# Patient Record
Sex: Female | Born: 1985 | Race: Black or African American | Hispanic: No | Marital: Single | Smoking: Never smoker
Health system: Southern US, Community
[De-identification: ages and names within clinical notes are randomized; demographics above are authoritative.]

## PROBLEM LIST (undated history)

## (undated) DIAGNOSIS — N390 Urinary tract infection, site not specified: Secondary | ICD-10-CM

---

## 2008-09-24 ENCOUNTER — Emergency Department (HOSPITAL_COMMUNITY): Admission: EM | Admit: 2008-09-24 | Discharge: 2008-09-24 | Payer: Self-pay | Admitting: Family Medicine

## 2008-10-09 ENCOUNTER — Inpatient Hospital Stay (HOSPITAL_COMMUNITY): Admission: AD | Admit: 2008-10-09 | Discharge: 2008-10-09 | Payer: Self-pay | Admitting: Obstetrics and Gynecology

## 2008-10-09 ENCOUNTER — Inpatient Hospital Stay (HOSPITAL_COMMUNITY): Admission: AD | Admit: 2008-10-09 | Discharge: 2008-10-10 | Payer: Self-pay | Admitting: Obstetrics and Gynecology

## 2008-10-11 ENCOUNTER — Inpatient Hospital Stay (HOSPITAL_COMMUNITY): Admission: AD | Admit: 2008-10-11 | Discharge: 2008-10-11 | Payer: Self-pay | Admitting: Obstetrics and Gynecology

## 2008-10-18 ENCOUNTER — Inpatient Hospital Stay (HOSPITAL_COMMUNITY): Admission: AD | Admit: 2008-10-18 | Discharge: 2008-10-18 | Payer: Self-pay | Admitting: Obstetrics and Gynecology

## 2009-04-19 ENCOUNTER — Inpatient Hospital Stay (HOSPITAL_COMMUNITY): Admission: AD | Admit: 2009-04-19 | Discharge: 2009-04-19 | Payer: Self-pay | Admitting: Obstetrics and Gynecology

## 2009-10-11 ENCOUNTER — Inpatient Hospital Stay (HOSPITAL_COMMUNITY): Admission: AD | Admit: 2009-10-11 | Discharge: 2009-10-14 | Payer: Self-pay | Admitting: Obstetrics and Gynecology

## 2011-02-10 ENCOUNTER — Emergency Department (HOSPITAL_COMMUNITY)
Admission: EM | Admit: 2011-02-10 | Discharge: 2011-02-10 | Disposition: A | Discharge status: Home / Self Care | Payer: No Typology Code available for payment source | Attending: Emergency Medicine | Admitting: Emergency Medicine

## 2011-02-10 DIAGNOSIS — R51 Headache: Secondary | ICD-10-CM | POA: Insufficient documentation

## 2011-02-10 DIAGNOSIS — M549 Dorsalgia, unspecified: Secondary | ICD-10-CM | POA: Insufficient documentation

## 2011-02-10 DIAGNOSIS — S335XXA Sprain of ligaments of lumbar spine, initial encounter: Secondary | ICD-10-CM | POA: Insufficient documentation

## 2011-02-10 DIAGNOSIS — S139XXA Sprain of joints and ligaments of unspecified parts of neck, initial encounter: Secondary | ICD-10-CM | POA: Insufficient documentation

## 2011-02-10 DIAGNOSIS — M542 Cervicalgia: Secondary | ICD-10-CM | POA: Insufficient documentation

## 2011-02-21 LAB — CBC
HCT: 30.7 % — ABNORMAL LOW (ref 36.0–46.0)
Hemoglobin: 10.1 g/dL — ABNORMAL LOW (ref 12.0–15.0)
MCHC: 33 g/dL (ref 30.0–36.0)
MCV: 82.5 fL (ref 78.0–100.0)
Platelets: 256 10*3/uL (ref 150–400)
Platelets: 286 10*3/uL (ref 150–400)
RBC: 3.38 MIL/uL — ABNORMAL LOW (ref 3.87–5.11)
WBC: 5.9 10*3/uL (ref 4.0–10.5)

## 2011-02-21 LAB — RPR: RPR Ser Ql: NONREACTIVE

## 2011-06-10 ENCOUNTER — Emergency Department (HOSPITAL_COMMUNITY)
Admission: EM | Admit: 2011-06-10 | Discharge: 2011-06-10 | Disposition: A | Discharge status: Home / Self Care | Payer: Self-pay | Attending: Emergency Medicine | Admitting: Emergency Medicine

## 2011-06-10 DIAGNOSIS — B9689 Other specified bacterial agents as the cause of diseases classified elsewhere: Secondary | ICD-10-CM | POA: Insufficient documentation

## 2011-06-10 DIAGNOSIS — N949 Unspecified condition associated with female genital organs and menstrual cycle: Secondary | ICD-10-CM | POA: Insufficient documentation

## 2011-06-10 DIAGNOSIS — N760 Acute vaginitis: Secondary | ICD-10-CM | POA: Insufficient documentation

## 2011-06-10 DIAGNOSIS — A499 Bacterial infection, unspecified: Secondary | ICD-10-CM | POA: Insufficient documentation

## 2011-06-10 DIAGNOSIS — N390 Urinary tract infection, site not specified: Secondary | ICD-10-CM | POA: Insufficient documentation

## 2011-06-10 DIAGNOSIS — R109 Unspecified abdominal pain: Secondary | ICD-10-CM | POA: Insufficient documentation

## 2011-06-10 DIAGNOSIS — K6289 Other specified diseases of anus and rectum: Secondary | ICD-10-CM | POA: Insufficient documentation

## 2011-06-10 DIAGNOSIS — R350 Frequency of micturition: Secondary | ICD-10-CM | POA: Insufficient documentation

## 2011-06-10 LAB — URINE MICROSCOPIC-ADD ON

## 2011-06-10 LAB — POCT PREGNANCY, URINE: Preg Test, Ur: NEGATIVE

## 2011-06-10 LAB — WET PREP, GENITAL: Yeast Wet Prep HPF POC: NONE SEEN

## 2011-06-10 LAB — URINALYSIS, ROUTINE W REFLEX MICROSCOPIC
Bilirubin Urine: NEGATIVE
Ketones, ur: NEGATIVE mg/dL
Protein, ur: 100 mg/dL — AB
Specific Gravity, Urine: 1.016 (ref 1.005–1.030)

## 2011-06-12 LAB — URINE CULTURE

## 2011-08-21 LAB — POCT URINALYSIS DIP (DEVICE)
Bilirubin Urine: NEGATIVE
Ketones, ur: NEGATIVE mg/dL
Operator id: 247071
Protein, ur: 30 mg/dL — AB
Specific Gravity, Urine: 1.02 (ref 1.005–1.030)

## 2011-08-21 LAB — POCT PREGNANCY, URINE: Preg Test, Ur: POSITIVE

## 2011-08-22 LAB — CBC
HCT: 28.3 — ABNORMAL LOW
HCT: 28.6 — ABNORMAL LOW
Hemoglobin: 9.6 — ABNORMAL LOW
MCV: 76.1 — ABNORMAL LOW
MCV: 76.4 — ABNORMAL LOW
Platelets: 379
RBC: 3.7 — ABNORMAL LOW
RBC: 3.76 — ABNORMAL LOW
RDW: 16.5 — ABNORMAL HIGH
RDW: 16.9 — ABNORMAL HIGH
WBC: 4.4
WBC: 7

## 2011-08-22 LAB — HCG, QUANTITATIVE, PREGNANCY: hCG, Beta Chain, Quant, S: 939 — ABNORMAL HIGH

## 2011-08-22 LAB — WET PREP, GENITAL: Trich, Wet Prep: NONE SEEN

## 2014-04-14 ENCOUNTER — Encounter (HOSPITAL_COMMUNITY): Payer: Self-pay | Admitting: Emergency Medicine

## 2014-04-14 ENCOUNTER — Emergency Department (HOSPITAL_COMMUNITY)
Admission: EM | Admit: 2014-04-14 | Discharge: 2014-04-14 | Disposition: A | Discharge status: Home / Self Care | Payer: PRIVATE HEALTH INSURANCE | Source: Home / Self Care | Attending: Family Medicine | Admitting: Family Medicine

## 2014-04-14 DIAGNOSIS — N390 Urinary tract infection, site not specified: Secondary | ICD-10-CM

## 2014-04-14 HISTORY — DX: Urinary tract infection, site not specified: N39.0

## 2014-04-14 LAB — POCT URINALYSIS DIP (DEVICE)
BILIRUBIN URINE: NEGATIVE
Glucose, UA: NEGATIVE mg/dL
Hgb urine dipstick: NEGATIVE
Ketones, ur: NEGATIVE mg/dL
Nitrite: NEGATIVE
Protein, ur: 30 mg/dL — AB
SPECIFIC GRAVITY, URINE: 1.02 (ref 1.005–1.030)
UROBILINOGEN UA: 1 mg/dL (ref 0.0–1.0)
pH: 6 (ref 5.0–8.0)

## 2014-04-14 MED ORDER — CEPHALEXIN 500 MG PO CAPS: 500.0000 mg | ORAL_CAPSULE | Freq: Four times a day (QID) | ORAL | Status: DC

## 2014-04-14 NOTE — ED Provider Notes (Signed)
CSN: [id number]     Arrival date & time 04/14/14  1117 History   First MD Initiated Contact with Patient 04/14/14 [phone]     Chief Complaint  Patient presents with  . Sore Throat  . Urinary Tract Infection   (Consider location/radiation/quality/duration/timing/severity/associated sxs/prior Treatment) Patient is a 28 y.o. female presenting with dysuria. The history is provided by the patient. No language interpreter was used.  Dysuria Pain quality:  Aching and burning Pain severity:  Moderate Onset quality:  Gradual Timing:  Constant Progression:  Worsening Chronicity:  New Recent urinary tract infections: no   Relieved by:  Nothing Worsened by:  Nothing tried Ineffective treatments:  None tried Risk factors: pregnant now     Past Medical History  Diagnosis Date  . UTI (lower urinary tract infection)    Past Surgical History  Procedure Laterality Date  . Cesarean section     No family history on file. History  Substance Use Topics  . Smoking status: Never Smoker   . Smokeless tobacco: Not on file  . Alcohol Use: Yes   OB History   Grav Para Term Preterm Abortions TAB SAB Ect Mult Living                 Review of Systems  Genitourinary: Positive for dysuria.  All other systems reviewed and are negative.   Allergies  Review of patient's allergies indicates no known allergies.  Home Medications   Prior to Admission medications   Medication Sig Start Date End Date Taking? Authorizing Provider  phenazopyridine (AZO-TABS) 95 MG tablet Take 95 mg by mouth 3 (three) times daily as needed for pain.   Yes Historical Provider, MD   BP 126/89  Pulse 100  Temp(Src) 98.8 F (37.1 C) (Oral)  Resp 20  SpO2 99%  LMP 04/06/2014 Physical Exam  Nursing note and vitals reviewed. Constitutional: She is oriented to person, place, and time. She appears well-developed and well-nourished.  HENT:  Head: Normocephalic.  Eyes: Conjunctivae and EOM are normal. Pupils are equal,  round, and reactive to light.  Neck: Normal range of motion.  Cardiovascular: Normal rate and normal heart sounds.   Pulmonary/Chest: Effort normal.  Abdominal: Soft. She exhibits no distension.  Musculoskeletal: Normal range of motion.  Neurological: She is alert and oriented to person, place, and time.  Skin: Skin is warm.  Psychiatric: She has a normal mood and affect.    ED Course  Procedures (including critical care time) Labs Review Labs Reviewed  POCT URINALYSIS DIP (DEVICE) - Abnormal; Notable for the following:    Protein, ur 30 (*)    Leukocytes, UA SMALL (*)    All other components within normal limits    Imaging Review No results found.   MDM   1. UTI (lower urinary tract infection)    Keflex 500mg  qid x 10 days    Elson Areas, PA-C 04/14/14 1013

## 2014-04-14 NOTE — ED Notes (Signed)
Onset Friday (5/22) of symptoms:  Patient has abdominal pain, low back pain, and these areas are painful with getting up and sitting down.  Reports "weird" feeling with urination.

## 2014-04-14 NOTE — Discharge Instructions (Signed)
Urinary Tract Infection  Urinary tract infections (UTIs) can develop anywhere along your urinary tract. Your urinary tract is your body's drainage system for removing wastes and extra water. Your urinary tract includes two kidneys, two ureters, a bladder, and a urethra. Your kidneys are a pair of bean-shaped organs. Each kidney is about the size of your fist. They are located below your ribs, one on each side of your spine.  CAUSES  Infections are caused by microbes, which are microscopic organisms, including fungi, viruses, and bacteria. These organisms are so small that they can only be seen through a microscope. Bacteria are the microbes that most commonly cause UTIs.  SYMPTOMS   Symptoms of UTIs may vary by age and gender of the patient and by the location of the infection. Symptoms in young women typically include a frequent and intense urge to urinate and a painful, burning feeling in the bladder or urethra during urination. Older women and men are more likely to be tired, shaky, and weak and have muscle aches and abdominal pain. A fever may mean the infection is in your kidneys. Other symptoms of a kidney infection include pain in your back or sides below the ribs, nausea, and vomiting.  DIAGNOSIS  To diagnose a UTI, your caregiver will ask you about your symptoms. Your caregiver also will ask to provide a urine sample. The urine sample will be tested for bacteria and white blood cells. White blood cells are made by your body to help fight infection.  TREATMENT   Typically, UTIs can be treated with medication. Because most UTIs are caused by a bacterial infection, they usually can be treated with the use of antibiotics. The choice of antibiotic and length of treatment depend on your symptoms and the type of bacteria causing your infection.  HOME CARE INSTRUCTIONS   If you were prescribed antibiotics, take them exactly as your caregiver instructs you. Finish the medication even if you feel better after you  have only taken some of the medication.   Drink enough water and fluids to keep your urine clear or pale yellow.   Avoid caffeine, tea, and carbonated beverages. They tend to irritate your bladder.   Empty your bladder often. Avoid holding urine for long periods of time.   Empty your bladder before and after sexual intercourse.   After a bowel movement, women should cleanse from front to back. Use each tissue only once.  SEEK MEDICAL CARE IF:    You have back pain.   You develop a fever.   Your symptoms do not begin to resolve within 3 days.  SEEK IMMEDIATE MEDICAL CARE IF:    You have severe back pain or lower abdominal pain.   You develop chills.   You have nausea or vomiting.   You have continued burning or discomfort with urination.  MAKE SURE YOU:    Understand these instructions.   Will watch your condition.   Will get help right away if you are not doing well or get worse.  Document Released: 08/15/2005 Document Revised: 05/06/2012 Document Reviewed: 12/14/2011  [employer] Patient Information 2014 [employer]

## 2014-04-15 NOTE — ED Provider Notes (Signed)
Medical screening examination/treatment/procedure(s) were performed by a resident physician or non-physician practitioner and as the supervising physician I was immediately available for consultation/collaboration.  Clementeen Graham, MD    Rodolph Bong, MD 04/15/14 [phone]

## 2014-04-18 ENCOUNTER — Emergency Department (HOSPITAL_COMMUNITY): Payer: PRIVATE HEALTH INSURANCE

## 2014-04-18 ENCOUNTER — Encounter (HOSPITAL_COMMUNITY): Payer: Self-pay | Admitting: Emergency Medicine

## 2014-04-18 ENCOUNTER — Emergency Department (HOSPITAL_COMMUNITY)
Admission: EM | Admit: 2014-04-18 | Discharge: 2014-04-19 | Disposition: A | Discharge status: Home / Self Care | Payer: PRIVATE HEALTH INSURANCE | Attending: Emergency Medicine | Admitting: Emergency Medicine

## 2014-04-18 DIAGNOSIS — T8332XA Displacement of intrauterine contraceptive device, initial encounter: Secondary | ICD-10-CM

## 2014-04-18 DIAGNOSIS — N939 Abnormal uterine and vaginal bleeding, unspecified: Secondary | ICD-10-CM

## 2014-04-18 DIAGNOSIS — Y849 Medical procedure, unspecified as the cause of abnormal reaction of the patient, or of later complication, without mention of misadventure at the time of the procedure: Secondary | ICD-10-CM | POA: Insufficient documentation

## 2014-04-18 DIAGNOSIS — N898 Other specified noninflammatory disorders of vagina: Secondary | ICD-10-CM | POA: Insufficient documentation

## 2014-04-18 DIAGNOSIS — Z3202 Encounter for pregnancy test, result negative: Secondary | ICD-10-CM | POA: Insufficient documentation

## 2014-04-18 DIAGNOSIS — T8339XA Other mechanical complication of intrauterine contraceptive device, initial encounter: Secondary | ICD-10-CM | POA: Insufficient documentation

## 2014-04-18 DIAGNOSIS — R109 Unspecified abdominal pain: Secondary | ICD-10-CM

## 2014-04-18 DIAGNOSIS — Z87440 Personal history of urinary (tract) infections: Secondary | ICD-10-CM | POA: Insufficient documentation

## 2014-04-18 DIAGNOSIS — Z792 Long term (current) use of antibiotics: Secondary | ICD-10-CM | POA: Insufficient documentation

## 2014-04-18 DIAGNOSIS — Z9889 Other specified postprocedural states: Secondary | ICD-10-CM | POA: Insufficient documentation

## 2014-04-18 LAB — CBC WITH DIFFERENTIAL/PLATELET
BASOS ABS: 0 10*3/uL (ref 0.0–0.1)
BASOS PCT: 0 % (ref 0–1)
EOS ABS: 0.1 10*3/uL (ref 0.0–0.7)
Eosinophils Relative: 2 % (ref 0–5)
HCT: 33.8 % — ABNORMAL LOW (ref 36.0–46.0)
HEMOGLOBIN: 11.1 g/dL — AB (ref 12.0–15.0)
Lymphocytes Relative: 40 % (ref 12–46)
Lymphs Abs: 2 10*3/uL (ref 0.7–4.0)
MCH: 25.7 pg — AB (ref 26.0–34.0)
MCHC: 32.8 g/dL (ref 30.0–36.0)
MCV: 78.2 fL (ref 78.0–100.0)
MONO ABS: 0.4 10*3/uL (ref 0.1–1.0)
Monocytes Relative: 8 % (ref 3–12)
Neutro Abs: 2.6 10*3/uL (ref 1.7–7.7)
Neutrophils Relative %: 50 % (ref 43–77)
Platelets: 397 10*3/uL (ref 150–400)
RBC: 4.32 MIL/uL (ref 3.87–5.11)
RDW: 14.8 % (ref 11.5–15.5)
WBC: 5.1 10*3/uL (ref 4.0–10.5)

## 2014-04-18 LAB — COMPREHENSIVE METABOLIC PANEL
ALBUMIN: 3.3 g/dL — AB (ref 3.5–5.2)
ALT: 16 U/L (ref 0–35)
AST: 13 U/L (ref 0–37)
Alkaline Phosphatase: 60 U/L (ref 39–117)
BUN: 9 mg/dL (ref 6–23)
CALCIUM: 9.7 mg/dL (ref 8.4–10.5)
CO2: 26 mEq/L (ref 19–32)
CREATININE: 0.69 mg/dL (ref 0.50–1.10)
Chloride: 104 mEq/L (ref 96–112)
GFR calc Af Amer: >90 mL/min (ref >90)
GFR calc non Af Amer: >90 mL/min (ref >90)
Glucose, Bld: 93 mg/dL (ref 70–99)
Potassium: 4.2 mEq/L (ref 3.7–5.3)
Sodium: 141 mEq/L (ref 137–147)
TOTAL PROTEIN: 8 g/dL (ref 6.0–8.3)
Total Bilirubin: 0.2 mg/dL — ABNORMAL LOW (ref 0.3–1.2)

## 2014-04-18 LAB — URINALYSIS, ROUTINE W REFLEX MICROSCOPIC
BILIRUBIN URINE: NEGATIVE
Glucose, UA: NEGATIVE mg/dL
Ketones, ur: NEGATIVE mg/dL
Nitrite: NEGATIVE
Protein, ur: 30 mg/dL — AB
Specific Gravity, Urine: 1.02 (ref 1.005–1.030)
UROBILINOGEN UA: 0.2 mg/dL (ref 0.0–1.0)
pH: 5.5 (ref 5.0–8.0)

## 2014-04-18 LAB — URINE MICROSCOPIC-ADD ON

## 2014-04-18 LAB — LIPASE, BLOOD: LIPASE: 31 U/L (ref 11–59)

## 2014-04-18 LAB — POC URINE PREG, ED: Preg Test, Ur: NEGATIVE

## 2014-04-18 MED ORDER — ONDANSETRON HCL 4 MG/2ML IJ SOLN
4.0000 mg | Freq: Once | INTRAMUSCULAR | Status: AC
  Administered 2014-04-18: 4 mg via INTRAVENOUS
  Filled 2014-04-18: qty 2

## 2014-04-18 MED ORDER — MORPHINE SULFATE 4 MG/ML IJ SOLN
4.0000 mg | Freq: Once | INTRAMUSCULAR | Status: AC
  Administered 2014-04-18: 4 mg via INTRAVENOUS
  Filled 2014-04-18: qty 1

## 2014-04-18 NOTE — ED Notes (Signed)
MD at bedside. 

## 2014-04-18 NOTE — ED Notes (Addendum)
Pt returned from CT °

## 2014-04-18 NOTE — ED Provider Notes (Signed)
CSN: [id number]     Arrival date & time 04/18/14  1938 History   First MD Initiated Contact with Patient 04/18/14 2159     Chief Complaint  Patient presents with  . poss uti       Patient is a 28 y.o. female presenting with flank pain. The history is provided by the patient.  Flank Pain This is a new problem. The current episode started 2 days ago. The problem occurs constantly. The problem has been gradually worsening. Pertinent negatives include no chest pain. Nothing aggravates the symptoms. Nothing relieves the symptoms.  pt reports diagnosed with UTI last week She has been taking keflex Starting in the past 48 hours she has noted left flank pain that radiates into left lower abdomen No fever No vomiting She also reports heavy vaginal bleeding but reports she has IUD in palce  Past Medical History  Diagnosis Date  . UTI (lower urinary tract infection)    Past Surgical History  Procedure Laterality Date  . Cesarean section     No family history on file. History  Substance Use Topics  . Smoking status: Never Smoker   . Smokeless tobacco: Not on file  . Alcohol Use: Yes   OB History   Grav Para Term Preterm Abortions TAB SAB Ect Mult Living                 Review of Systems  Cardiovascular: Negative for chest pain.  Gastrointestinal: Negative for vomiting.  Genitourinary: Positive for flank pain and vaginal bleeding.  Neurological: Negative for weakness.  All other systems reviewed and are negative.     Allergies  Review of patient's allergies indicates no known allergies.  Home Medications   Prior to Admission medications   Medication Sig Start Date End Date Taking? Authorizing Provider  acetaminophen (TYLENOL) 500 MG tablet Take 500 mg by mouth every 6 (six) hours as needed for mild pain.   Yes Historical Provider, MD  cephALEXin (KEFLEX) 500 MG capsule Take 1 capsule (500 mg total) by mouth 4 (four) times daily. 04/14/14  Yes Elson AreasLeslie K Sofia, PA-C   levonorgestrel (MIRENA) 20 MCG/24HR IUD 1 each by Intrauterine route once.   Yes Historical Provider, MD  phenazopyridine (AZO-TABS) 95 MG tablet Take 95 mg by mouth 3 (three) times daily as needed for pain.   Yes Historical Provider, MD   BP 125/92  Pulse 96  Temp(Src) 98.2 F (36.8 C) (Oral)  Resp 18  SpO2 100%  LMP 04/06/2014 Physical Exam CONSTITUTIONAL: Well developed/well nourished HEAD: Normocephalic/atraumatic EYES: EOMI/PERRL ENMT: Mucous membranes moist NECK: supple no meningeal signs SPINE:entire spine nontender CV: S1/S2 noted, no murmurs/rubs/gallops noted LUNGS: Lungs are clear to auscultation bilaterally, no apparent distress ABDOMEN: soft, nontender, no rebound or guarding. She is obese WU:JWJXGU:left cva tenderness Vaginal bleeding noted on pelvic.  No cmt.  No adnexal mass/tenderness.  Female chaperone present NEURO: Pt is awake/alert, moves all extremitiesx4 EXTREMITIES: pulses normal, full ROM SKIN: warm, color normal PSYCH: no abnormalities of mood noted  ED Course  Procedures  10:55 PM Pt with recent diagnosis of uti, now with flank pain Will obtain CT imaging to evaluate for any ureteral stone 12:27 AM Pt improved CT imaging reveals malpositioned IUD but no other complicating features or perforation Abdomen soft on recheck She was advised to avoid sexual activity and call her OBGYN She is to continue her home antibiotics We discussed strict return precautions Labs Review Labs Reviewed  CBC WITH DIFFERENTIAL - Abnormal; Notable  for the following:    Hemoglobin 11.1 (*)    HCT 33.8 (*)    MCH 25.7 (*)    All other components within normal limits  COMPREHENSIVE METABOLIC PANEL - Abnormal; Notable for the following:    Albumin 3.3 (*)    Total Bilirubin 0.2 (*)    All other components within normal limits  URINALYSIS, ROUTINE W REFLEX MICROSCOPIC - Abnormal; Notable for the following:    Color, Urine RED (*)    APPearance CLOUDY (*)    Hgb urine  dipstick LARGE (*)    Protein, ur 30 (*)    Leukocytes, UA MODERATE (*)    All other components within normal limits  URINE MICROSCOPIC-ADD ON - Abnormal; Notable for the following:    Squamous Epithelial / LPF FEW (*)    Bacteria, UA FEW (*)    All other components within normal limits  LIPASE, BLOOD  POC URINE PREG, ED    Imaging Review Ct Abdomen Pelvis Wo Contrast  04/18/2014   CLINICAL DATA:  Flank pain. Currently treated for urinary tract infection.  EXAM: CT ABDOMEN AND PELVIS WITHOUT CONTRAST  TECHNIQUE: Multidetector CT imaging of the abdomen and pelvis was performed following the standard protocol without IV contrast.  COMPARISON:  None.  FINDINGS: BODY WALL: Umbilical fat herniation.  LOWER CHEST: Unremarkable.  ABDOMEN/PELVIS:  Liver: No focal abnormality.  Biliary: No evidence of biliary obstruction or stone.  Pancreas: Unremarkable.  Spleen: Unremarkable.  Adrenals: Unremarkable.  Kidneys and ureters: No hydronephrosis or stone. No asymmetric enlargement.  Bladder: Unremarkable.  Reproductive: IUD which is rotated relative to the long axis of the uterus. Additionally, the crossbar appears short of the expected location of the fundic endometrial margin by approximately 2 cm. The stem extends into the posterior fornix or posterior cervix. No serosal perforation.  Bowel: No obstruction. Negative appendix.  Retroperitoneum: No mass or adenopathy.  Peritoneum: No free fluid or gas.  Vascular: No acute abnormality.  OSSEOUS: No acute abnormalities.  IMPRESSION: 1. No hydronephrosis. 2. Malpositioned IUD without serosal perforation.   Electronically Signed   By: Tiburcio Pea M.D.   On: 04/18/2014 23:55      MDM   Final diagnoses:  Malpositioned IUD  Vaginal bleeding  Flank pain    Nursing notes including past medical history and social history reviewed and considered in documentation Labs/vital reviewed and considered     Joya Gaskins, MD 04/19/14 [phone]

## 2014-04-18 NOTE — ED Notes (Signed)
A urine cuilture is pending from [facility]

## 2014-04-18 NOTE — ED Notes (Signed)
EDP and RN at bedside to perform pelvic exam. Pt has too much vaginal bleeding for swabs. EDP perform manual exam and speculum exam.

## 2014-04-18 NOTE — ED Notes (Signed)
Taking keflex for UTI, prescribed at [facility]. Culture results to be ready on Monday.  C/o pain and GU bleeding onset Friday. "not sure if it was my period or UTI related". Reports irregular periods d/t IUD. Describes pain as cramping that began in back and wraps around to abd (denies: nvd fever, vaginal sx, urgency, frequency, retention, numbness tingling radiation down legs or other sx).

## 2014-04-18 NOTE — ED Notes (Signed)
The pt was seen at [facility] on Wednesday and treated for a uti.  She is still taking the meds but since Friday the pain has increased in her lower back and her lower abd.  lmp now

## 2014-04-19 MED ORDER — OXYCODONE-ACETAMINOPHEN 5-325 MG PO TABS: 1.0000 | ORAL_TABLET | ORAL | Status: DC | PRN

## 2014-04-19 NOTE — Discharge Instructions (Signed)
PLEASE AVOID SEXUAL ACTIVITY UNTIL YOU ARE EVALUATED BY YOUR OBGYN   SEEK IMMEDIATE MEDICAL ATTENTION IF: The pain does not go away or becomes severe, particularly over the next 8-12 hours.  A temperature above 100.70F develops.  Repeated vomiting occurs (multiple episodes).   Blood is being passed in stools or vomit (bright red or black tarry stools).  Return also if you develop chest pain, difficulty breathing, dizziness or fainting, or become confused, poorly responsive, or inconsolable.

## 2014-04-19 NOTE — ED Notes (Signed)
Dr. Bebe Shaggy at [facility] speaking with pt & family about d/c plan and results. Pt lying down resting, NAD, calm, interactive.

## 2015-12-14 ENCOUNTER — Encounter (HOSPITAL_COMMUNITY): Payer: Self-pay | Admitting: Emergency Medicine

## 2015-12-14 ENCOUNTER — Emergency Department (HOSPITAL_COMMUNITY): Payer: PRIVATE HEALTH INSURANCE

## 2015-12-14 ENCOUNTER — Emergency Department (HOSPITAL_COMMUNITY)
Admission: EM | Admit: 2015-12-14 | Discharge: 2015-12-14 | Disposition: A | Discharge status: Home / Self Care | Payer: PRIVATE HEALTH INSURANCE | Attending: Emergency Medicine | Admitting: Emergency Medicine

## 2015-12-14 DIAGNOSIS — R51 Headache: Secondary | ICD-10-CM | POA: Insufficient documentation

## 2015-12-14 DIAGNOSIS — M25512 Pain in left shoulder: Secondary | ICD-10-CM | POA: Diagnosis not present

## 2015-12-14 DIAGNOSIS — H53149 Visual discomfort, unspecified: Secondary | ICD-10-CM | POA: Insufficient documentation

## 2015-12-14 DIAGNOSIS — E669 Obesity, unspecified: Secondary | ICD-10-CM | POA: Insufficient documentation

## 2015-12-14 DIAGNOSIS — R519 Headache, unspecified: Secondary | ICD-10-CM

## 2015-12-14 DIAGNOSIS — Z87440 Personal history of urinary (tract) infections: Secondary | ICD-10-CM | POA: Diagnosis not present

## 2015-12-14 DIAGNOSIS — R079 Chest pain, unspecified: Secondary | ICD-10-CM | POA: Insufficient documentation

## 2015-12-14 LAB — I-STAT TROPONIN, ED: Troponin i, poc: 0 ng/mL (ref 0.00–0.08)

## 2015-12-14 LAB — BASIC METABOLIC PANEL
ANION GAP: 9 (ref 5–15)
BUN: 5 mg/dL — AB (ref 6–20)
CO2: 27 mmol/L (ref 22–32)
Calcium: 9 mg/dL (ref 8.9–10.3)
Chloride: 106 mmol/L (ref 101–111)
Creatinine, Ser: 0.68 mg/dL (ref 0.44–1.00)
GFR calc Af Amer: >60 mL/min (ref >60)
Glucose, Bld: 110 mg/dL — ABNORMAL HIGH (ref 65–99)
Potassium: 3.6 mmol/L (ref 3.5–5.1)
Sodium: 142 mmol/L (ref 135–145)

## 2015-12-14 LAB — CBC
HCT: 31.4 % — ABNORMAL LOW (ref 36.0–46.0)
Hemoglobin: 9.9 g/dL — ABNORMAL LOW (ref 12.0–15.0)
MCH: 24.7 pg — AB (ref 26.0–34.0)
MCHC: 31.5 g/dL (ref 30.0–36.0)
MCV: 78.3 fL (ref 78.0–100.0)
PLATELETS: 354 10*3/uL (ref 150–400)
RBC: 4.01 MIL/uL (ref 3.87–5.11)
RDW: 15.7 % — ABNORMAL HIGH (ref 11.5–15.5)
WBC: 6.1 10*3/uL (ref 4.0–10.5)

## 2015-12-14 MED ORDER — ONDANSETRON HCL 4 MG/2ML IJ SOLN
4.0000 mg | Freq: Once | INTRAMUSCULAR | Status: AC
  Administered 2015-12-14: 4 mg via INTRAVENOUS
  Filled 2015-12-14: qty 2

## 2015-12-14 MED ORDER — SODIUM CHLORIDE 0.9 % IV BOLUS (SEPSIS)
1000.0000 mL | Freq: Once | INTRAVENOUS | Status: AC
  Administered 2015-12-14: 1000 mL via INTRAVENOUS

## 2015-12-14 MED ORDER — KETOROLAC TROMETHAMINE 30 MG/ML IJ SOLN
30.0000 mg | Freq: Once | INTRAMUSCULAR | Status: AC
  Administered 2015-12-14: 30 mg via INTRAVENOUS
  Filled 2015-12-14: qty 1

## 2015-12-14 MED ORDER — DIPHENHYDRAMINE HCL 50 MG/ML IJ SOLN
25.0000 mg | Freq: Once | INTRAMUSCULAR | Status: AC
  Administered 2015-12-14: 25 mg via INTRAVENOUS
  Filled 2015-12-14: qty 1

## 2015-12-14 NOTE — ED Notes (Signed)
PA at bedside.

## 2015-12-14 NOTE — Discharge Instructions (Signed)
General Headache Without Cause °A headache is pain or discomfort felt around the head or neck area. The specific cause of a headache may not be found. There are many causes and types of headaches. A few common ones are: °· Tension headaches. °· Migraine headaches. °· Cluster headaches. °· Chronic daily headaches. °HOME CARE INSTRUCTIONS  °Watch your condition for any changes. Take these steps to help with your condition: °Managing Pain °· Take over-the-counter and prescription medicines only as told by your health care provider. °· Lie down in a dark, quiet room when you have a headache. °· If directed, apply ice to the head and neck area: °· Put ice in a plastic bag. °· Place a towel between your skin and the bag. °· Leave the ice on for 20 minutes, 2-3 times per day. °· Use a heating pad or hot shower to apply heat to the head and neck area as told by your health care provider. °· Keep lights dim if bright lights bother you or make your headaches worse. °Eating and Drinking °· Eat meals on a regular schedule. °· Limit alcohol use. °· Decrease the amount of caffeine you drink, or stop drinking caffeine. °General Instructions °· Keep all follow-up visits as told by your health care provider. This is important. °· Keep a headache journal to help find out what may trigger your headaches. For example, write down: °· What you eat and drink. °· How much sleep you get. °· Any change to your diet or medicines. °· Try massage or other relaxation techniques. °· Limit stress. °· Sit up straight, and do not tense your muscles. °· Do not use tobacco products, including cigarettes, chewing tobacco, or e-cigarettes. If you need help quitting, ask your health care provider. °· Exercise regularly as told by your health care provider. °· Sleep on a regular schedule. Get 7-9 hours of sleep, or the amount recommended by your health care provider. °SEEK MEDICAL CARE IF:  °· Your symptoms are not helped by medicine. °· You have a  headache that is different from the usual headache. °· You have nausea or you vomit. °· You have a fever. °SEEK IMMEDIATE MEDICAL CARE IF:  °· Your headache becomes severe. °· You have repeated vomiting. °· You have a stiff neck. °· You have a loss of vision. °· You have problems with speech. °· You have pain in the eye or ear. °· You have muscular weakness or loss of muscle control. °· You lose your balance or have trouble walking. °· You feel faint or pass out. °· You have confusion. °  °This information is not intended to replace advice given to you by your health care provider. Make sure you discuss any questions you have with your health care provider. °  °Document Released: 11/05/2005 Document Revised: 07/27/2015 Document Reviewed: 02/28/2015 °Elsevier Interactive Patient Education ©2016 [employer] ° °Nonspecific Chest Pain  °Chest pain can be caused by many different conditions. There is always a chance that your pain could be related to something serious, such as a heart attack or a blood clot in your lungs. Chest pain can also be caused by conditions that are not life-threatening. If you have chest pain, it is very important to follow up with your health care provider. °CAUSES  °Chest pain can be caused by: °· Heartburn. °· Pneumonia or bronchitis. °· Anxiety or stress. °· Inflammation around your heart (pericarditis) or lung (pleuritis or pleurisy). °· A blood clot in your lung. °· A collapsed   lung (pneumothorax). It can develop suddenly on its own (spontaneous pneumothorax) or from trauma to the chest. °· Shingles infection (varicella-zoster virus). °· Heart attack. °· Damage to the bones, muscles, and cartilage that make up your chest wall. This can include: °¨ Bruised bones due to injury. °¨ Strained muscles or cartilage due to frequent or repeated coughing or overwork. °¨ Fracture to one or more ribs. °¨ Sore cartilage due to inflammation (costochondritis). °RISK FACTORS  °Risk factors for chest  pain may include: °· Activities that increase your risk for trauma or injury to your chest. °· Respiratory infections or conditions that cause frequent coughing. °· Medical conditions or overeating that can cause heartburn. °· Heart disease or family history of heart disease. °· Conditions or health behaviors that increase your risk of developing a blood clot. °· Having had chicken pox (varicella zoster). °SIGNS AND SYMPTOMS °Chest pain can feel like: °· Burning or tingling on the surface of your chest or deep in your chest. °· Crushing, pressure, aching, or squeezing pain. °· Dull or sharp pain that is worse when you move, cough, or take a deep breath. °· Pain that is also felt in your back, neck, shoulder, or arm, or pain that spreads to any of these areas. °Your chest pain may come and go, or it may stay constant. °DIAGNOSIS °Lab tests or other studies may be needed to find the cause of your pain. Your health care provider may have you take a test called an ambulatory ECG (electrocardiogram). An ECG records your heartbeat patterns at the time the test is performed. You may also have other tests, such as: °· Transthoracic echocardiogram (TTE). During echocardiography, sound waves are used to create a picture of all of the heart structures and to look at how blood flows through your heart. °· Transesophageal echocardiogram (TEE). This is a more advanced imaging test that obtains images from inside your body. It allows your health care provider to see your heart in finer detail. °· Cardiac monitoring. This allows your health care provider to monitor your heart rate and rhythm in real time. °· Holter monitor. This is a portable device that records your heartbeat and can help to diagnose abnormal heartbeats. It allows your health care provider to track your heart activity for several days, if needed. °· Stress tests. These can be done through exercise or by taking medicine that makes your heart beat more  quickly. °· Blood tests. °· Imaging tests. °TREATMENT  °Your treatment depends on what is causing your chest pain. Treatment may include: °· Medicines. These may include: °¨ Acid blockers for heartburn. °¨ Anti-inflammatory medicine. °¨ Pain medicine for inflammatory conditions. °¨ Antibiotic medicine, if an infection is present. °¨ Medicines to dissolve blood clots. °¨ Medicines to treat coronary artery disease. °· Supportive care for conditions that do not require medicines. This may include: °¨ Resting. °¨ Applying heat or cold packs to injured areas. °¨ Limiting activities until pain decreases. °HOME CARE INSTRUCTIONS °· If you were prescribed an antibiotic medicine, finish it all even if you start to feel better. °· Avoid any activities that bring on chest pain. °· Do not use any tobacco products, including cigarettes, chewing tobacco, or electronic cigarettes. If you need help quitting, ask your health care provider. °· Do not drink alcohol. °· Take medicines only as directed by your health care provider. °· Keep all follow-up visits as directed by your health care provider. This is important. This includes any further testing if your chest pain   does not go away. °· If heartburn is the cause for your chest pain, you may be told to keep your head raised (elevated) while sleeping. This reduces the chance that acid will go from your stomach into your esophagus. °· Make lifestyle changes as directed by your health care provider. These may include: °¨ Getting regular exercise. Ask your health care provider to suggest some activities that are safe for you. °¨ Eating a heart-healthy diet. A registered dietitian can help you to learn healthy eating options. °¨ Maintaining a healthy weight. °¨ Managing diabetes, if necessary. °¨ Reducing stress. °SEEK MEDICAL CARE IF: °· Your chest pain does not go away after treatment. °· You have a rash with blisters on your chest. °· You have a fever. °SEEK IMMEDIATE MEDICAL CARE  IF:  °· Your chest pain is worse. °· You have an increasing cough, or you cough up blood. °· You have severe abdominal pain. °· You have severe weakness. °· You faint. °· You have chills. °· You have sudden, unexplained chest discomfort. °· You have sudden, unexplained discomfort in your arms, back, neck, or jaw. °· You have shortness of breath at any time. °· You suddenly start to sweat, or your skin gets clammy. °· You feel nauseous or you vomit. °· You suddenly feel light-headed or dizzy. °· Your heart begins to beat quickly, or it feels like it is skipping beats. °These symptoms may represent a serious problem that is an emergency. Do not wait to see if the symptoms will go away. Get medical help right away. Call your local emergency services (911 in the [place] Do not drive yourself to the hospital. °  °This information is not intended to replace advice given to you by your health care provider. Make sure you discuss any questions you have with your health care provider. °  °Document Released: 08/15/2005 Document Revised: 11/26/2014 Document Reviewed: 06/11/2014 °Elsevier Interactive Patient Education ©2016 [employer] ° °

## 2015-12-14 NOTE — ED Provider Notes (Signed)
CSN: [id number]     Arrival date & time 12/14/15  1739 History   First MD Initiated Contact with Patient 12/14/15 2034     Chief Complaint  Patient presents with  . Chest Pain  . Headache     (Consider location/radiation/quality/duration/timing/severity/associated sxs/prior Treatment) HPI   Patient with a PMH of obesity, c-section presents to the ER with headache and CP for the past 4 days.   1. Headache  headache started slowly 4 days ago and gradually worsened. He intermittently goes away but then will return sometimes. She describes it as across the front of her for head and is a throbbing pain, she states it hard to hold her eyes open associated photophobia. She is not had any fevers, nausea, vomiting, diarrhea. Her headache is currently a 10 out of 10 pain. She states this is the main reason why she came to the ER although she is having some chest pain  2. Chest pain She started having chest pain the same time she is having headache she states it has been constant and at no time in the past 4 days hasn't improved. He describes it as a sharp pain at the base of her clavicle on the left. She says it does not get worse with lying flat, it does not get worse with exertion, it does not get worse with coughing or with movement. It does wax and wane mildly with headache. He denies having had chest pains in the past. She says that the pain resolved upon arrival to the emergency department.  She denies orthopnea, nausea, vomiting, diarrhea, weakness, fever, neck pain, rash, sore throat, ear pain, abdominal pain, shortness of breath, coughing, change in vision  Past Medical History  Diagnosis Date  . UTI (lower urinary tract infection)    Past Surgical History  Procedure Laterality Date  . Cesarean section     No family history on file. Social History  Substance Use Topics  . Smoking status: Never Smoker   . Smokeless tobacco: None  . Alcohol Use: Yes   OB History    No data  available     Review of Systems  Review of Systems All other systems negative except as documented in the HPI. All pertinent positives and negatives as reviewed in the HPI.   Allergies  Review of patient's allergies indicates no known allergies.  Home Medications   Prior to Admission medications   Medication Sig Start Date End Date Taking? Authorizing Provider  ibuprofen (ADVIL,MOTRIN) 200 MG tablet Take 200 mg by mouth every 6 (six) hours as needed for moderate pain.   Yes Historical Provider, MD  cephALEXin (KEFLEX) 500 MG capsule Take 1 capsule (500 mg total) by mouth 4 (four) times daily. Patient not taking: Reported on 12/14/2015 04/14/14   Elson Areas, PA-C  oxyCODONE-acetaminophen (PERCOCET/ROXICET) 5-325 MG per tablet Take 1 tablet by mouth every 4 (four) hours as needed for severe pain. Patient not taking: Reported on 12/14/2015 04/19/14   Zadie Rhine, MD   BP 124/82 mmHg  Pulse 95  Temp(Src) 98.8 F (37.1 C) (Oral)  Resp 20  Ht  (1.626 m)  Wt 140.615 kg  BMI 53.19 kg/m2  SpO2 96%  LMP 12/14/2015 Physical Exam  Constitutional: She appears well-developed and well-nourished. No distress.  HENT:  Head: Normocephalic and atraumatic.  Right Ear: Tympanic membrane and ear canal normal.  Left Ear: Tympanic membrane and ear canal normal.  Nose: Nose normal.  Mouth/Throat: Uvula is midline, oropharynx is  clear and moist and mucous membranes are normal.  Eyes: Pupils are equal, round, and reactive to light.  Neck: Normal range of motion. Neck supple.  Cardiovascular: Normal rate and regular rhythm.   Pulmonary/Chest: Effort normal and breath sounds normal. She has no decreased breath sounds. She has no wheezes. She exhibits no tenderness.  Abdominal: Soft.  No signs of abdominal distention  Musculoskeletal:  No LE swelling  Neurological: She is alert.  Acting at baseline Cranial nerves grossly intact on exam. Pt alert and oriented x 3 Upper and lower extremity  strength is symmetrical and physiologic Normal muscular tone No facial droop Coordination intact, no limb ataxia,No pronator drift  Skin: Skin is warm and dry. No rash noted.  Nursing note and vitals reviewed.   ED Course  Procedures (including critical care time) Labs Review Labs Reviewed  BASIC METABOLIC PANEL - Abnormal; Notable for the following:    Glucose, Bld 110 (*)    BUN 5 (*)    All other components within normal limits  CBC - Abnormal; Notable for the following:    Hemoglobin 9.9 (*)    HCT 31.4 (*)    MCH 24.7 (*)    RDW 15.7 (*)    All other components within normal limits  I-STAT TROPOININ, ED    Imaging Review Dg Chest 2 View  12/14/2015  CLINICAL DATA:  Chest pain and headache for 4 days EXAM: CHEST  2 VIEW COMPARISON:  None. FINDINGS: Lungs are clear. Heart size and pulmonary vascularity are normal. No adenopathy. No pneumothorax. No bone lesions. There is upper thoracic dextroscoliosis. IMPRESSION: No edema or consolidation. Electronically Signed   By: Bretta Bang III M.D.   On: 12/14/2015 18:13   I have personally reviewed and evaluated these images and lab results as part of my medical decision-making.   EKG Interpretation None      MDM   Final diagnoses:  Nonintractable headache, unspecified chronicity pattern, unspecified headache type    The patient is PERC negative with less than 2% chance of PE.  Patient is to be discharged with recommendation to follow up with PCP in regards to today's hospital visit. Chest pain is not likely of cardiac or pulmonary etiology d/t presentation, perc negative, VSS, no tracheal deviation, no JVD or new murmur, RRR, breath sounds equal bilaterally, EKG without acute abnormalities, negative troponin, and negative CXR. Pt has been advised to return to the ED is CP becomes exertional, associated with diaphoresis or nausea, radiates to left jaw/arm, worsens or becomes concerning in any way. Pt appears reliable for  follow up and is agreeable to discharge.   Presentation is non concerning for [facility], ICH, Meningitis, or temporal arteritis. Pt is afebrile with no focal neuro deficits, nuchal rigidity, or change in vision.  Patients symptoms resolved with fluids and pain medication in the ER, she feels safe her DC. Referral to Neuro and PCP.  Marlon Pel, PA-C 12/14/15 2300  Linwood Dibbles, MD 12/14/15 [phone]

## 2015-12-14 NOTE — ED Notes (Signed)
PT is asleep at this time. PT's visitor reports she has been asleep since she received the medication. PT's visitor has no requests at this time

## 2015-12-14 NOTE — ED Notes (Signed)
PT reports headache pain 10/10

## 2015-12-14 NOTE — ED Notes (Signed)
Pt to ED via [facility]>  C/o pain to L upper chest with headache x 4 days.  Denies sob, nausea, and vomiting.  Pain worse with movement.

## 2015-12-14 NOTE — ED Notes (Signed)
Pt ambulated to nursing station with no difficulty, steady gait.

## 2015-12-30 ENCOUNTER — Emergency Department (INDEPENDENT_AMBULATORY_CARE_PROVIDER_SITE_OTHER): Payer: PRIVATE HEALTH INSURANCE

## 2015-12-30 ENCOUNTER — Encounter (HOSPITAL_COMMUNITY): Payer: Self-pay | Admitting: *Deleted

## 2015-12-30 ENCOUNTER — Emergency Department (INDEPENDENT_AMBULATORY_CARE_PROVIDER_SITE_OTHER)
Admission: EM | Admit: 2015-12-30 | Discharge: 2015-12-30 | Disposition: A | Discharge status: Home / Self Care | Payer: PRIVATE HEALTH INSURANCE | Source: Home / Self Care | Attending: Internal Medicine | Admitting: Internal Medicine

## 2015-12-30 DIAGNOSIS — J111 Influenza due to unidentified influenza virus with other respiratory manifestations: Secondary | ICD-10-CM

## 2015-12-30 DIAGNOSIS — R69 Illness, unspecified: Principal | ICD-10-CM

## 2015-12-30 MED ORDER — KETOROLAC TROMETHAMINE 60 MG/2ML IM SOLN
60.0000 mg | Freq: Once | INTRAMUSCULAR | Status: AC
  Administered 2015-12-30: 60 mg via INTRAMUSCULAR

## 2015-12-30 MED ORDER — IPRATROPIUM BROMIDE 0.02 % IN SOLN
RESPIRATORY_TRACT | Status: AC
  Filled 2015-12-30: qty 2.5

## 2015-12-30 MED ORDER — ALBUTEROL SULFATE (2.5 MG/3ML) 0.083% IN NEBU
5.0000 mg | INHALATION_SOLUTION | Freq: Once | RESPIRATORY_TRACT | Status: AC
  Administered 2015-12-30: 5 mg via RESPIRATORY_TRACT

## 2015-12-30 MED ORDER — PREDNISONE 50 MG PO TABS: 50.0000 mg | ORAL_TABLET | Freq: Every day | ORAL | Status: DC

## 2015-12-30 MED ORDER — ALBUTEROL SULFATE (2.5 MG/3ML) 0.083% IN NEBU
INHALATION_SOLUTION | RESPIRATORY_TRACT | Status: AC
  Filled 2015-12-30: qty 6

## 2015-12-30 MED ORDER — IPRATROPIUM BROMIDE 0.02 % IN SOLN
0.5000 mg | Freq: Once | RESPIRATORY_TRACT | Status: AC
  Administered 2015-12-30: 0.5 mg via RESPIRATORY_TRACT

## 2015-12-30 MED ORDER — SODIUM CHLORIDE 0.9 % IV BOLUS (SEPSIS)
1000.0000 mL | Freq: Once | INTRAVENOUS | Status: AC
  Administered 2015-12-30: 1000 mL via INTRAVENOUS

## 2015-12-30 MED ORDER — AZITHROMYCIN 250 MG PO TABS: 250.0000 mg | ORAL_TABLET | Freq: Every day | ORAL | Status: DC

## 2015-12-30 MED ORDER — KETOROLAC TROMETHAMINE 60 MG/2ML IM SOLN
INTRAMUSCULAR | Status: AC
  Filled 2015-12-30: qty 2

## 2015-12-30 NOTE — Discharge Instructions (Signed)
Push fluids, rest. Note for work given, with return to work date of February 13. If unable to return to work, please be rechecked at the urgent care. Prescriptions for Zithromax and prednisone sent to [facility] on [address]. Recheck for new fever greater than 100.5, increasing phlegm, worsening breathlessness, or if not improving in a few days.  Influenza, Adult Influenza (flu) is an infection in the mouth, nose, and throat (respiratory tract) caused by a virus. The flu can make you feel very ill. Influenza spreads easily from person to person (contagious).  HOME CARE   Only take medicines as told by your doctor.  Use a cool mist humidifier to make breathing easier.  Get plenty of rest until your fever goes away. This usually takes 3 to 4 days.  Drink enough fluids to keep your pee (urine) clear or pale yellow.  Cover your mouth and nose when you cough or sneeze.  Wash your hands well to avoid spreading the flu.  Stay home from work or school until your fever has been gone for at least 1 full day.  Get a flu shot every year. GET HELP RIGHT AWAY IF:   You have trouble breathing or feel short of breath.  Your skin or nails turn blue.  You have severe neck pain or stiffness.  You have a severe headache, facial pain, or earache.  Your fever gets worse or keeps coming back.  You feel sick to your stomach (nauseous), throw up (vomit), or have watery poop (diarrhea).  You have chest pain.  You have a deep cough that gets worse, or you cough up more thick spit (mucus). MAKE SURE YOU:   Understand these instructions.  Will watch your condition.  Will get help right away if you are not doing well or get worse.   This information is not intended to replace advice given to you by your health care provider. Make sure you discuss any questions you have with your health care provider.   Document Released: 08/14/2008 Document Revised: 11/26/2014 Document Reviewed:  02/04/2012 Elsevier Interactive Patient Education [employer]

## 2015-12-30 NOTE — ED Notes (Signed)
Pt  Reports   Cough   And   Congested            X  4  Days     Pt  sorethroat  And  Body  Aches      Symptoms      Body  Aches    As  Well

## 2015-12-30 NOTE — ED Provider Notes (Signed)
CSN: [id number]     Arrival date & time 12/30/15  1309 History   None    Chief complaint: Cough  HPI Patient is a 30 year old lady presents today with 4 day history of increasing prostration, malaise, moist cough, low-grade fever. Sore throat. Feels a little dyspneic with exertion today. Lots of coughing. Missing work.  Past Medical History  Diagnosis Date  . UTI (lower urinary tract infection)    Past Surgical History  Procedure Laterality Date  . Cesarean section     History reviewed. No pertinent family history. Social History  Substance Use Topics  . Smoking status: Never Smoker   . Smokeless tobacco: None  . Alcohol Use: Yes    Review of Systems  All other systems reviewed and are negative.   Allergies  Review of patient's allergies indicates no known allergies.  Home Medications  none  Meds Ordered and Administered this Visit   Medications  ketorolac (TORADOL) injection 60 mg (60 mg Intramuscular Given 12/30/15 1413)  ipratropium (ATROVENT) nebulizer solution 0.5 mg (0.5 mg Nebulization Given 12/30/15 1413)  albuterol (PROVENTIL) (2.5 MG/3ML) 0.083% nebulizer solution 5 mg (5 mg Nebulization Given 12/30/15 1413)  sodium chloride 0.9 % bolus 1,000 mL (1,000 mLs Intravenous Given 12/30/15 1519)    BP 130/84 mmHg  Pulse 120  Temp(Src) 99.2 F (37.3 C) (Oral)  Resp 18  SpO2 95%  LMP 12/14/2015   Physical Exam  Constitutional: She is oriented to person, place, and time.  Alert, nicely groomed Sitting up on the end of the exam table, looks ill but not toxic   HENT:  Head: Atraumatic.  Eyes:  Conjugate gaze, no eye redness/drainage Eyes are a little watery  Neck: Neck supple.  Cardiovascular: Regular rhythm.   Heart rate 110s  Pulmonary/Chest: She has no wheezes. She has no rales.  Breath sounds diminished posteriorly, somewhat coarse, symmetric breath sounds Frequent coughing during exam  Abdominal: She exhibits no distension.  Musculoskeletal: Normal  range of motion.  No leg swelling  Neurological: She is alert and oriented to person, place, and time.  Skin: Skin is warm and dry.  No cyanosis  Nursing note and vitals reviewed.   ED Course  Procedures (including critical care time)  Imaging Review Dg Chest 2 View  12/30/2015  CLINICAL DATA:  30 year old female with fever, cough, congestion and shortness of breath for 5 days. EXAM: CHEST  2 VIEW COMPARISON:  12/14/2015 FINDINGS: This is a mildly low volume film. Minimal left basilar opacity is noted -favor atelectasis over airspace disease. The cardiomediastinal silhouette is unremarkable. There is no evidence of pulmonary edema, suspicious pulmonary nodule/mass, pleural effusion, or pneumothorax. No acute bony abnormalities are identified. IMPRESSION: Minimal left basilar opacity -favor atelectasis over airspace disease. Electronically Signed   By: Harmon Pier M.D.   On: 12/30/2015 14:14     MDM   1. Influenza-like illness    Push fluids. Rest.  Rx for zithromax, prednisone.  Note for work x 3d.  Recheck if not improving.      Eustace Moore, MD 01/05/16 1226

## 2016-02-10 ENCOUNTER — Encounter (HOSPITAL_COMMUNITY): Payer: Self-pay | Admitting: Emergency Medicine

## 2016-02-10 ENCOUNTER — Emergency Department (HOSPITAL_COMMUNITY)
Admission: EM | Admit: 2016-02-10 | Discharge: 2016-02-10 | Disposition: A | Discharge status: Home / Self Care | Payer: PRIVATE HEALTH INSURANCE | Attending: Emergency Medicine | Admitting: Emergency Medicine

## 2016-02-10 DIAGNOSIS — Z7952 Long term (current) use of systemic steroids: Secondary | ICD-10-CM | POA: Diagnosis not present

## 2016-02-10 DIAGNOSIS — B349 Viral infection, unspecified: Secondary | ICD-10-CM | POA: Diagnosis not present

## 2016-02-10 DIAGNOSIS — R05 Cough: Secondary | ICD-10-CM | POA: Diagnosis present

## 2016-02-10 DIAGNOSIS — Z87440 Personal history of urinary (tract) infections: Secondary | ICD-10-CM | POA: Diagnosis not present

## 2016-02-10 NOTE — ED Notes (Signed)
Patient states has had generalized body aches, nasal congestion, cough, chest congestion (denies chest pain), no fever x 1 week.   Patient states has taken theraflu and mucinex for symptoms with no relief.

## 2016-02-10 NOTE — ED Notes (Signed)
C/o cough, bodyaches x 1 week. Denies N/V.

## 2016-02-10 NOTE — ED Provider Notes (Signed)
CSN: [id number]     Arrival date & time 02/10/16  0944 History  By signing my name below, I, Linus GalasMaharshi Patel, attest that this documentation has been prepared under the direction and in the presence of Audry Piliyler Jariana Shumard, PA-C. Electronically Signed: Linus GalasMaharshi Patel, ED Scribe. 02/10/2016. 10:16 AM.   Chief Complaint  Patient presents with  . flu symptoms    The history is provided by the patient. No language interpreter was used.   HPI Comments: Rose Saunders is a 30 y.o. female who presents to the Emergency Department with no past pertinent history complaining of flu-like symptoms for the past 1 week.  Pt reports body aches, productive cough, sneezing, rhinorrhea, congestion, sore throat and ear pain. Pt rates her current pain 6/10 in severity. Pt tried Theraflu and Musinex with mild relief. Pt denies fever, CP, SOB, abdominal pain, nausea vomiting and diarrhea. Pt states a few of her co-workers are sick with similar symptoms.   Pt still has her tonsils.   Past Medical History  Diagnosis Date  . UTI (lower urinary tract infection)    Past Surgical History  Procedure Laterality Date  . Cesarean section     No family history on file. Social History  Substance Use Topics  . Smoking status: Never Smoker   . Smokeless tobacco: None  . Alcohol Use: Yes     Comment: socially   OB History    No data available     Review of Systems A complete 10 system review of systems was obtained and all systems are negative except as noted in the HPI and PMH.   Allergies  Review of patient's allergies indicates no known allergies.  Home Medications   Prior to Admission medications   Medication Sig Start Date End Date Taking? Authorizing Provider  azithromycin (ZITHROMAX) 250 MG tablet Take 1 tablet (250 mg total) by mouth daily. Take first 2 tablets together, then 1 every day until finished. 12/30/15   Eustace MooreLaura W Murray, MD  predniSONE (DELTASONE) 50 MG tablet Take 1 tablet (50 mg total) by mouth  daily. 12/30/15   Eustace MooreLaura W Murray, MD   BP 145/100 mmHg  Pulse 89  Temp(Src) 98.2 F (36.8 C) (Oral)  Resp 20  Ht 5\' 4"  (1.626 m)  Wt 336 lb 8 oz (152.635 kg)  BMI 57.73 kg/m2  SpO2 97%  LMP 01/17/2016   Physical Exam  Constitutional: She is oriented to person, place, and time. She appears well-developed and well-nourished. No distress.  HENT:  Head: Normocephalic and atraumatic.  Right Ear: Tympanic membrane, external ear and ear canal normal.  Left Ear: Tympanic membrane, external ear and ear canal normal.  Nose: Rhinorrhea present.  Mouth/Throat: Uvula is midline, oropharynx is clear and moist and mucous membranes are normal. No trismus in the jaw. No oropharyngeal exudate, posterior oropharyngeal erythema or tonsillar abscesses.  Eyes: EOM are normal. Pupils are equal, round, and reactive to light.  Neck: Normal range of motion. Neck supple. No tracheal deviation present.  Cardiovascular: Normal rate, regular rhythm, S1 normal, S2 normal, normal heart sounds, intact distal pulses and normal pulses.   Pulmonary/Chest: Effort normal and breath sounds normal. No respiratory distress. She has no decreased breath sounds. She has no wheezes. She has no rhonchi. She has no rales.  Abdominal: Normal appearance and bowel sounds are normal. She exhibits no distension. There is no tenderness.  Musculoskeletal: Normal range of motion.  Neurological: She is alert and oriented to person, place, and time.  Skin:  Skin is warm and dry.  Psychiatric: She has a normal mood and affect. Her speech is normal and behavior is normal. Thought content normal.  Nursing note and vitals reviewed.   ED Course  Procedures   DIAGNOSTIC STUDIES: Oxygen Saturation is 97% on room air, normal by my interpretation.    COORDINATION OF CARE: 10:12 AM Discussed treatment plan with pt at bedside and pt agreed to plan.   MDM  I have reviewed the relevant previous healthcare records. I obtained HPI from  historian.  ED Course:  Assessment: Pt is a 29yF presents with URI symptoms since Monday . On exam, pt in NAD. VSS. Afebrile. Lungs CTA, Heart RRR. Abdomen nontender/soft. No posterior erythema of oropharynx. No uvula deviation or swelling noted. No abscess noted. Patients symptoms are consistent with URI, likely viral etiology. Discussed that antibiotics are not indicated for viral infections. Pt will be discharged with symptomatic treatment.  Verbalizes understanding and is agreeable with plan. Pt is hemodynamically stable & in NAD prior to dc.  Disposition/Plan:  DC Home Additional Verbal discharge instructions given and discussed with patient.  Pt Instructed to f/u with PCP in the next week for evaluation and treatment of symptoms. Return precautions given Pt acknowledges and agrees with plan  Supervising Physician Vanetta Mulders, MD   Final diagnoses:  Viral syndrome   I personally performed the services described in this documentation, which was scribed in my presence. The recorded information has been reviewed and is accurate.   Audry Pili, PA-C 02/10/16 1020  Vanetta Mulders, MD 02/10/16 1037

## 2016-02-10 NOTE — Discharge Instructions (Signed)
Please read and follow all provided instructions.  Your diagnoses today include:  1. Viral syndrome    You appear to have an upper respiratory infection (URI). An upper respiratory tract infection, or cold, is a viral infection of the air passages leading to the lungs. It should improve gradually after 5-7 days. You may have a lingering cough that lasts for 2- 4 weeks after the infection.  Tests performed today include:  Vital signs. See below for your results today.   Medications prescribed:   Take any prescribed medications only as directed. Treatment for your infection is aimed at treating the symptoms. There are no medications, such as antibiotics, that will cure your infection.   Home care instructions:  Follow any educational materials contained in this packet.   Your illness is contagious and can be spread to others, especially during the first 3 or 4 days. It cannot be cured by antibiotics or other medicines. Take basic precautions such as washing your hands often, covering your mouth when you cough or sneeze, and avoiding public places where you could spread your illness to others.   Please continue drinking plenty of fluids.  Use over-the-counter medicines as needed as directed on packaging for symptom relief.  You may also use ibuprofen or tylenol as directed on packaging for pain or fever.  Do not take multiple medicines containing Tylenol or acetaminophen to avoid taking too much of this medication.  Follow-up instructions: Please follow-up with your primary care provider in the next 3 days for further evaluation of your symptoms if you are not feeling better.   Return instructions:   Please return to the Emergency Department if you experience worsening symptoms.   RETURN IMMEDIATELY IF you develop shortness of breath, confusion or altered mental status, a new rash, become dizzy, faint, or poorly responsive, or are unable to be cared for at home.  Please return if you have  persistent vomiting and cannot keep down fluids or develop a fever that is not controlled by tylenol or motrin.    Please return if you have any other emergent concerns.  Additional Information:  Your vital signs today were: BP 145/100 mmHg   Pulse 89   Temp(Src) 98.2 F (36.8 C) (Oral)   Resp 20   Ht 5\' 4"  (1.626 m)   Wt 152.635 kg   BMI 57.73 kg/m2   SpO2 97%   LMP 01/17/2016 If your blood pressure (BP) was elevated above 135/85 this visit, please have this repeated by your doctor within one month. --------------

## 2016-08-24 IMAGING — DX DG CHEST 2V
2 series · 2 of 2 positions shown · non-contrast
Comparison: 12/14/2015

CLINICAL DATA: 29-year-old female with fever, cough, congestion and
shortness of breath for 5 days.

EXAM:
CHEST  2 VIEW

[chest pa]
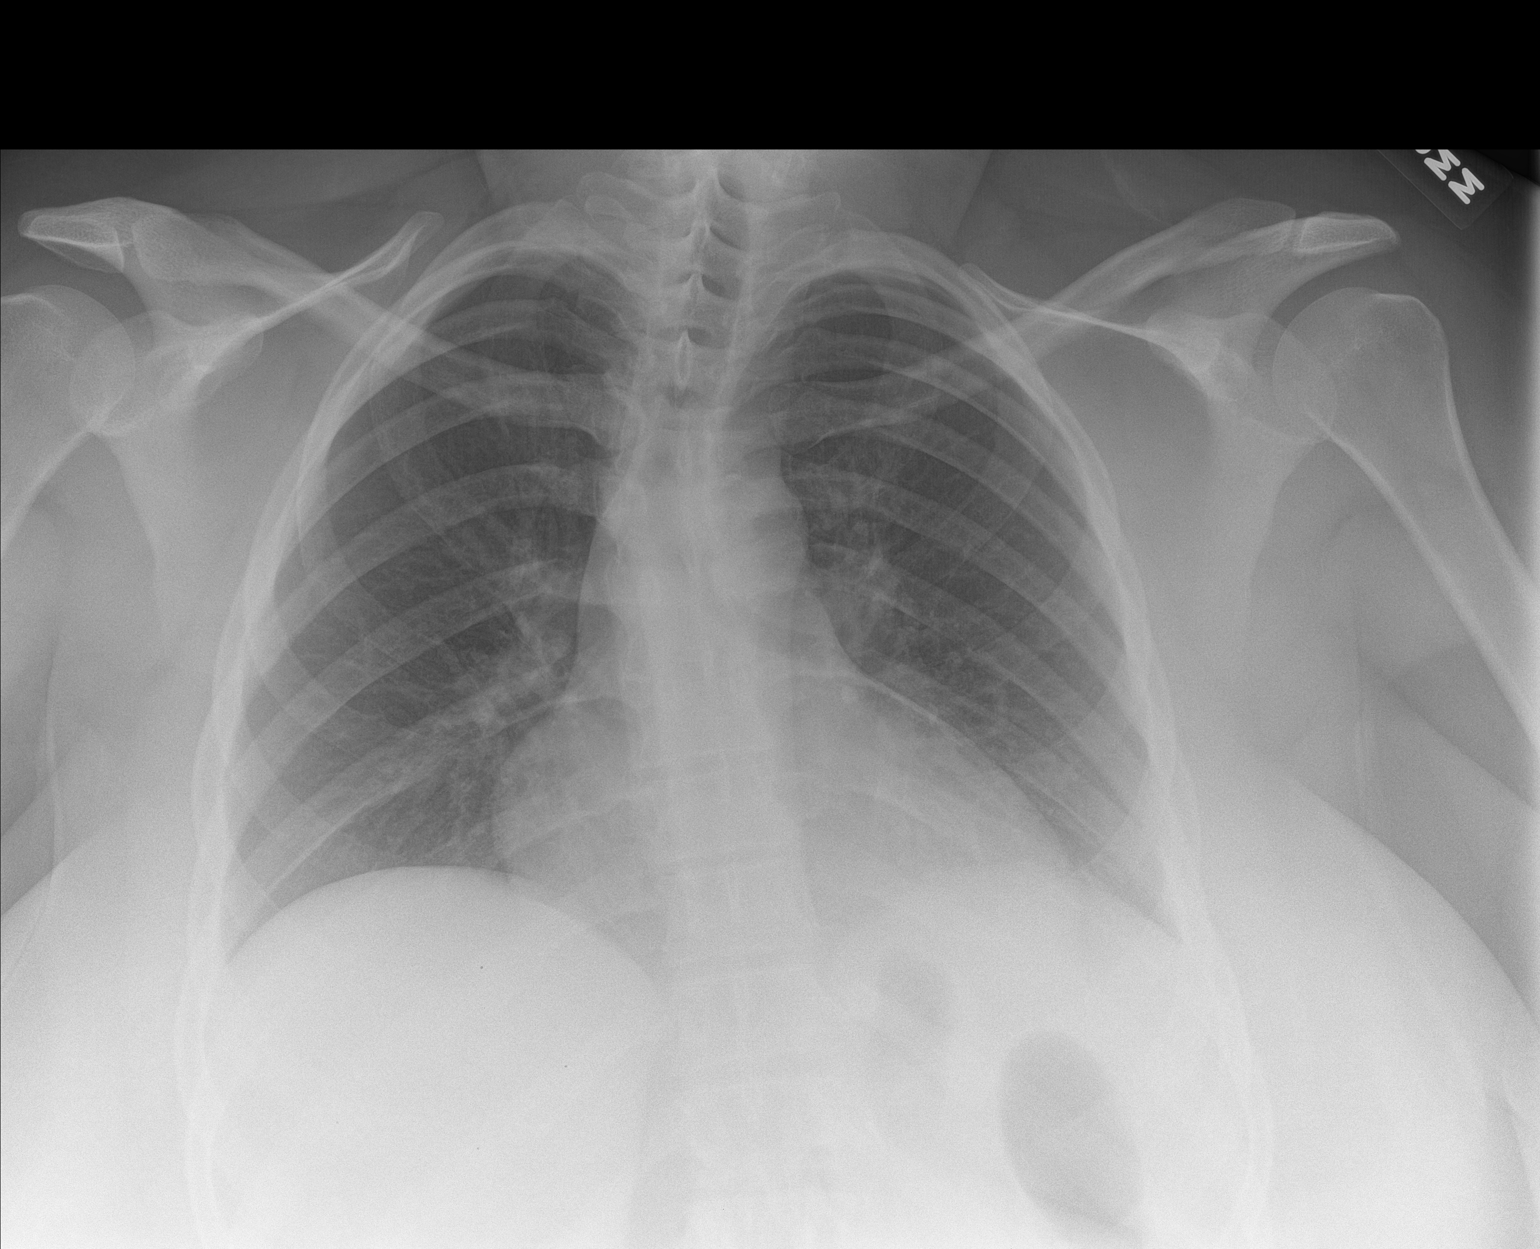

[chest lat]
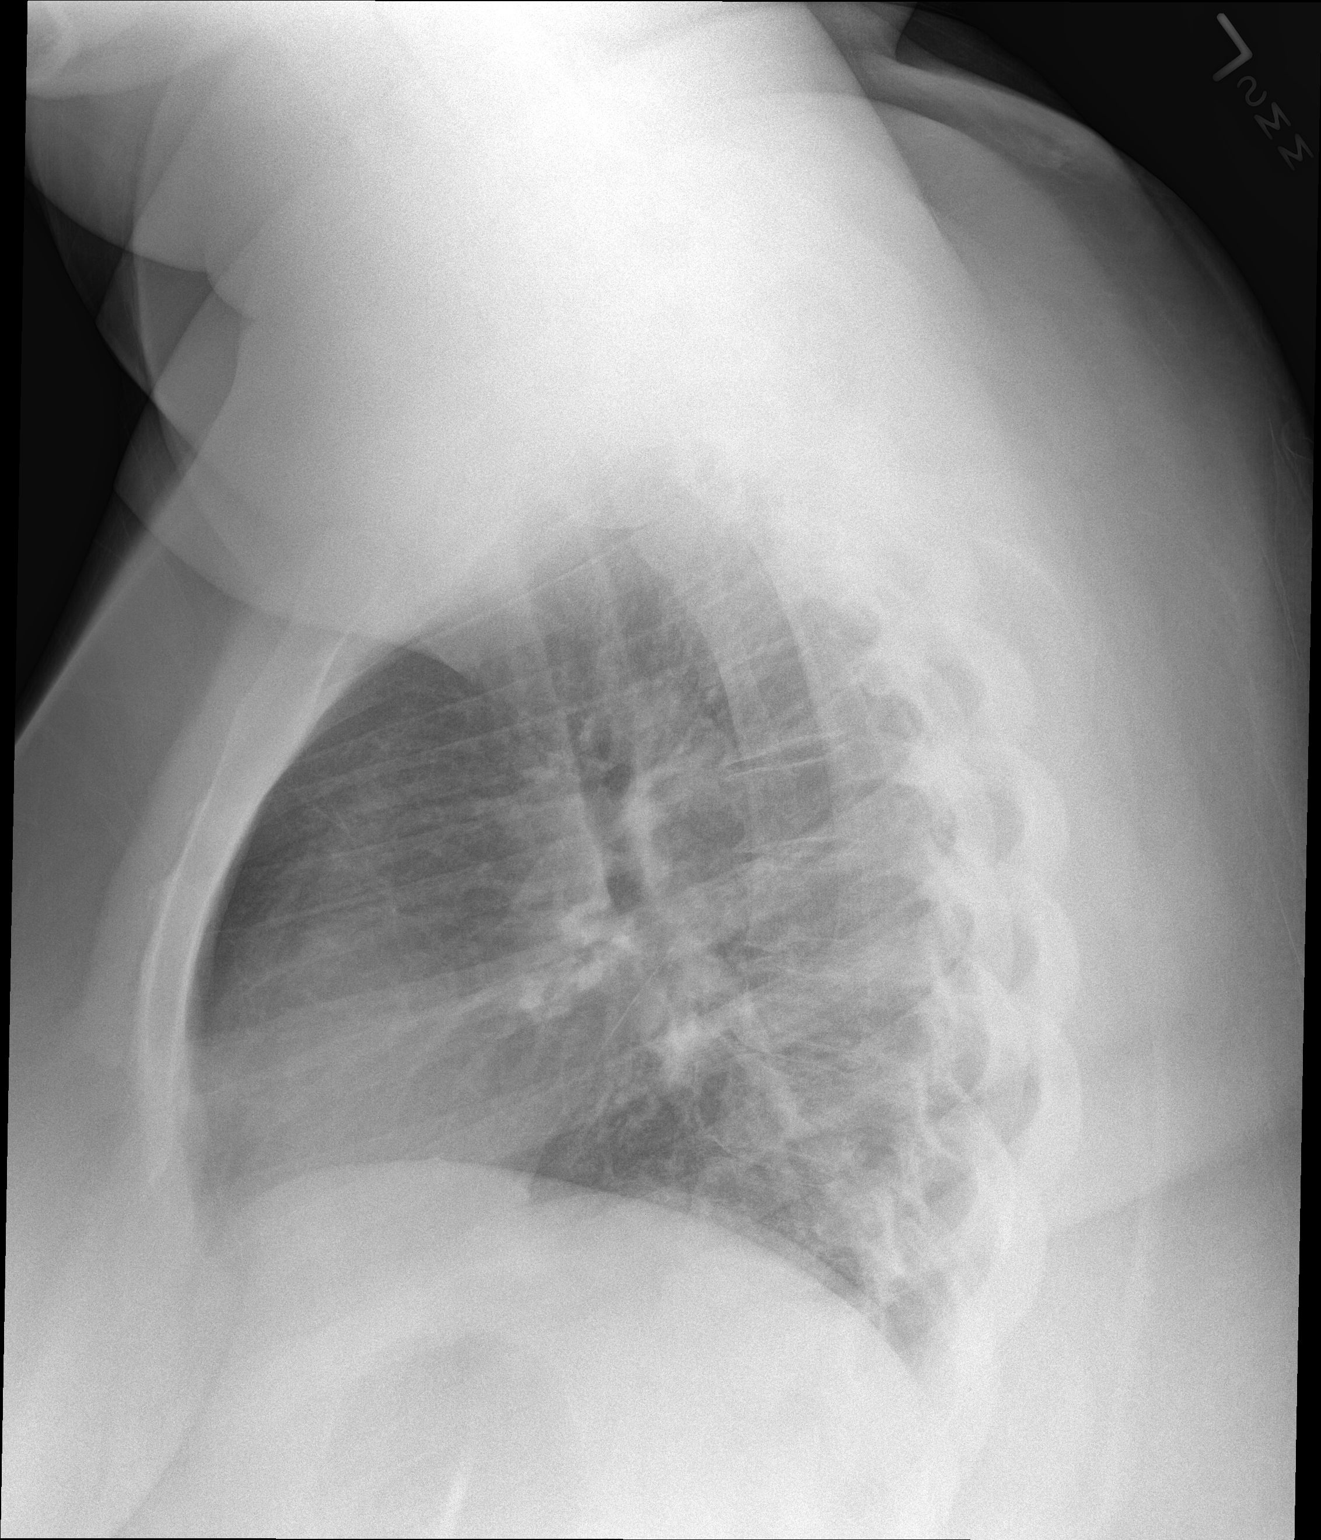

[2 of 2 positions shown; findings below may reference images not displayed]

FINDINGS: This is a mildly low volume film.

Minimal left basilar opacity is noted -favor atelectasis over
airspace disease.

The cardiomediastinal silhouette is unremarkable.

There is no evidence of pulmonary edema, suspicious pulmonary
nodule/mass, pleural effusion, or pneumothorax. No acute bony
abnormalities are identified.
IMPRESSION: Minimal left basilar opacity -favor atelectasis over airspace
disease.

## 2016-12-01 DIAGNOSIS — L02411 Cutaneous abscess of right axilla: Secondary | ICD-10-CM | POA: Insufficient documentation

## 2016-12-02 ENCOUNTER — Emergency Department (HOSPITAL_COMMUNITY)
Admission: EM | Admit: 2016-12-02 | Discharge: 2016-12-02 | Disposition: A | Discharge status: Home / Self Care | Payer: PRIVATE HEALTH INSURANCE | Attending: Emergency Medicine | Admitting: Emergency Medicine

## 2016-12-02 ENCOUNTER — Encounter (HOSPITAL_COMMUNITY): Payer: Self-pay

## 2016-12-02 DIAGNOSIS — L02411 Cutaneous abscess of right axilla: Secondary | ICD-10-CM

## 2016-12-02 MED ORDER — OXYCODONE-ACETAMINOPHEN 5-325 MG PO TABS
1.0000 | ORAL_TABLET | Freq: Once | ORAL | Status: AC
  Administered 2016-12-02: 1 via ORAL
  Filled 2016-12-02: qty 1

## 2016-12-02 MED ORDER — OXYCODONE-ACETAMINOPHEN 5-325 MG PO TABS: 1.0000 | ORAL_TABLET | ORAL | 0 refills | Status: DC | PRN

## 2016-12-02 MED ORDER — ONDANSETRON 4 MG PO TBDP
4.0000 mg | ORAL_TABLET | Freq: Once | ORAL | Status: AC | PRN
  Administered 2016-12-02: 4 mg via ORAL
  Filled 2016-12-02: qty 1

## 2016-12-02 MED ORDER — SULFAMETHOXAZOLE-TRIMETHOPRIM 800-160 MG PO TABS: 1.0000 | ORAL_TABLET | Freq: Two times a day (BID) | ORAL | 0 refills | Status: AC

## 2016-12-02 MED ORDER — LIDOCAINE HCL (PF) 1 % IJ SOLN
5.0000 mL | Freq: Once | INTRAMUSCULAR | Status: AC
  Administered 2016-12-02: 5 mL via INTRADERMAL
  Filled 2016-12-02: qty 5

## 2016-12-02 MED ORDER — NAPROXEN 500 MG PO TABS: 500.0000 mg | ORAL_TABLET | Freq: Two times a day (BID) | ORAL | 0 refills | Status: DC

## 2016-12-02 NOTE — Discharge Instructions (Signed)
We drained her abscess today. It will continued to drain over the next couple of days. Continue applying warm compresses to the area 3-4 times daily. Take Percocet for severe pain. Take Naprosyn twice daily for pain. Take Bactrim for infection. Return to the emergency department for uncontrolled pain, fever, chills, or any new or concerning symptoms.

## 2016-12-02 NOTE — ED Notes (Signed)
Pt complains of an abscess under her right arm in her armpit. Pt states this has been going on for 5 days. Pt states the pain became unbearable.

## 2016-12-02 NOTE — ED Triage Notes (Signed)
Pt states that since Tues she has had a abscess under R arm, no drainage noted.

## 2016-12-02 NOTE — ED Provider Notes (Signed)
[facility]-EMERGENCY DEPT Provider Note   CSN: [id number] Arrival date & time: 12/01/16  2335     History   Chief Complaint Chief Complaint  Patient presents with  . Abscess    HPI Rose Saunders is a 31 y.o. female.  HPI Patient presents for evaluation of possible abscess. Patient presents for 5 day history of gradual onset, constant, severe right axillary pain with associated swelling. No fever, chills, drainage. This is never happened before. She has not taken anything for her symptoms. This has never happened before.  Past Medical History:  Diagnosis Date  . UTI (lower urinary tract infection)     There are no active problems to display for this patient.   Past Surgical History:  Procedure Laterality Date  . CESAREAN SECTION      OB History    No data available       Home Medications    Prior to Admission medications   Medication Sig Start Date End Date Taking? Authorizing Provider  azithromycin (ZITHROMAX) 250 MG tablet Take 1 tablet (250 mg total) by mouth daily. Take first 2 tablets together, then 1 every day until finished. 12/30/15   Eustace MooreLaura W Murray, MD  naproxen (NAPROSYN) 500 MG tablet Take 1 tablet (500 mg total) by mouth 2 (two) times daily. 12/02/16   Cheri FowlerKayla Chyler Creely, PA-C  oxyCODONE-acetaminophen (PERCOCET/ROXICET) 5-325 MG tablet Take 1 tablet by mouth every 4 (four) hours as needed for severe pain. 12/02/16   Cheri FowlerKayla Sequoia Mincey, PA-C  predniSONE (DELTASONE) 50 MG tablet Take 1 tablet (50 mg total) by mouth daily. 12/30/15   Eustace MooreLaura W Murray, MD  sulfamethoxazole-trimethoprim (BACTRIM DS,SEPTRA DS) 800-160 MG tablet Take 1 tablet by mouth 2 (two) times daily. 12/02/16 12/09/16  Cheri FowlerKayla Temprance Wyre, PA-C    Family History No family history on file.  Social History Social History  Substance Use Topics  . Smoking status: Never Smoker  . Smokeless tobacco: Never Used  . Alcohol use Yes     Comment: socially     Allergies   Patient has no known allergies.   Review of  Systems Review of Systems All other systems negative unless otherwise stated in HPI   Physical Exam Updated Vital Signs BP 147/99 (BP Location: Left Arm)   Pulse 118   Temp 98.8 F (37.1 C) (Oral)   Resp 18   LMP 11/04/2016   SpO2 100%   Physical Exam  Constitutional: She is oriented to person, place, and time. She appears well-developed and well-nourished.  HENT:  Head: Normocephalic and atraumatic.  Right Ear: External ear normal.  Left Ear: External ear normal.  Eyes: Conjunctivae are normal. No scleral icterus.  Neck: No tracheal deviation present.  Pulmonary/Chest: Effort normal. No respiratory distress.  Abdominal: She exhibits no distension.  Musculoskeletal: Normal range of motion.  Neurological: She is alert and oriented to person, place, and time.  Skin: Skin is warm and dry.  Right axilla with 2x2 cm fluctuant area.  No overlying erythema.  Inferior surrounding induration.   Psychiatric: She has a normal mood and affect. Her behavior is normal.     ED Treatments / Results  Labs (all labs ordered are listed, but only abnormal results are displayed) Labs Reviewed - No data to display  EKG  EKG Interpretation None       Radiology No results found.  Procedures .Marland Kitchen.Incision and Drainage Date/Time: 12/02/2016 1:33 AM Performed by: Cheri FowlerOSE, Taetum Flewellen Authorized by: Cheri FowlerOSE, Edwyn Inclan   Consent:    Consent obtained:  Verbal   Consent given by:  Patient   Risks discussed:  Bleeding, pain and infection   Alternatives discussed:  No treatment and delayed treatment Location:    Type:  Abscess   Size:  2x2 cm   Location:  Upper extremity   Upper extremity location:  Arm   Arm location:  R upper arm Pre-procedure details:    Skin preparation:  Betadine Anesthesia (see MAR for exact dosages):    Anesthesia method:  Local infiltration   Local anesthetic:  Lidocaine 1% w/o epi Procedure type:    Complexity:  Complex Procedure details:    Incision types:  Single  straight   Scalpel blade:  11   Wound management:  Probed and deloculated, irrigated with saline and extensive cleaning   Drainage:  Purulent   Drainage amount:  Copious   Wound treatment:  Wound left open   Packing materials:  None Post-procedure details:    Patient tolerance of procedure:  Tolerated well, no immediate complications   (including critical care time)  Medications Ordered in ED Medications  lidocaine (PF) (XYLOCAINE) 1 % injection 5 mL (5 mLs Intradermal Given 12/02/16 0100)  oxyCODONE-acetaminophen (PERCOCET/ROXICET) 5-325 MG per tablet 1 tablet (1 tablet Oral Given 12/02/16 0100)     Initial Impression / Assessment and Plan / ED Course  I have reviewed the triage vital signs and the nursing notes.  Pertinent labs & imaging results that were available during my care of the patient were reviewed by me and considered in my medical decision making (see chart for details).  Clinical Course    Patient with skin abscess. Incision and drainage performed in the ED today.  Abscess was not large enough to warrant packing or drain placement. Wound recheck in 2 days. Supportive care and return precautions discussed.  Pt sent home with Bactrim due to surrounding cellulitis, percocet, and naproxen. The patient appears reasonably screened and/or stabilized for discharge and I doubt any other emergent medical condition requiring further screening, evaluation, or treatment in the ED prior to discharge.    Final Clinical Impressions(s) / ED Diagnoses   Final diagnoses:  Abscess of axilla, right    New Prescriptions New Prescriptions   NAPROXEN (NAPROSYN) 500 MG TABLET    Take 1 tablet (500 mg total) by mouth 2 (two) times daily.   OXYCODONE-ACETAMINOPHEN (PERCOCET/ROXICET) 5-325 MG TABLET    Take 1 tablet by mouth every 4 (four) hours as needed for severe pain.   SULFAMETHOXAZOLE-TRIMETHOPRIM (BACTRIM DS,SEPTRA DS) 800-160 MG TABLET    Take 1 tablet by mouth 2 (two) times daily.       Cheri Fowler, PA-C 12/02/16 0136    Melene Plan, DO 12/02/16 1610

## 2016-12-02 NOTE — ED Notes (Signed)
While going over paperwork pt got nauseated with a hot flash.

## 2017-08-03 ENCOUNTER — Emergency Department (HOSPITAL_COMMUNITY)
Admission: EM | Admit: 2017-08-03 | Discharge: 2017-08-03 | Disposition: A | Discharge status: Home / Self Care | Payer: Self-pay | Attending: Emergency Medicine | Admitting: Emergency Medicine

## 2017-08-03 ENCOUNTER — Emergency Department (HOSPITAL_COMMUNITY): Payer: Self-pay

## 2017-08-03 DIAGNOSIS — R030 Elevated blood-pressure reading, without diagnosis of hypertension: Secondary | ICD-10-CM | POA: Insufficient documentation

## 2017-08-03 DIAGNOSIS — J189 Pneumonia, unspecified organism: Secondary | ICD-10-CM

## 2017-08-03 DIAGNOSIS — Z79899 Other long term (current) drug therapy: Secondary | ICD-10-CM | POA: Insufficient documentation

## 2017-08-03 DIAGNOSIS — D500 Iron deficiency anemia secondary to blood loss (chronic): Secondary | ICD-10-CM | POA: Insufficient documentation

## 2017-08-03 DIAGNOSIS — N920 Excessive and frequent menstruation with regular cycle: Secondary | ICD-10-CM | POA: Insufficient documentation

## 2017-08-03 DIAGNOSIS — I10 Essential (primary) hypertension: Secondary | ICD-10-CM

## 2017-08-03 DIAGNOSIS — J181 Lobar pneumonia, unspecified organism: Secondary | ICD-10-CM | POA: Insufficient documentation

## 2017-08-03 LAB — BASIC METABOLIC PANEL
ANION GAP: 9 (ref 5–15)
BUN: 7 mg/dL (ref 6–20)
CALCIUM: 9.2 mg/dL (ref 8.9–10.3)
CO2: 24 mmol/L (ref 22–32)
CREATININE: 0.74 mg/dL (ref 0.44–1.00)
Chloride: 105 mmol/L (ref 101–111)
Glucose, Bld: 88 mg/dL (ref 65–99)
Potassium: 4.5 mmol/L (ref 3.5–5.1)
SODIUM: 138 mmol/L (ref 135–145)

## 2017-08-03 LAB — CBC
HCT: 30.6 % — ABNORMAL LOW (ref 36.0–46.0)
Hemoglobin: 9.7 g/dL — ABNORMAL LOW (ref 12.0–15.0)
MCH: 24.1 pg — AB (ref 26.0–34.0)
MCHC: 31.7 g/dL (ref 30.0–36.0)
MCV: 76.1 fL — ABNORMAL LOW (ref 78.0–100.0)
PLATELETS: 428 10*3/uL — AB (ref 150–400)
RBC: 4.02 MIL/uL (ref 3.87–5.11)
RDW: 16.8 % — AB (ref 11.5–15.5)
WBC: 7.2 10*3/uL (ref 4.0–10.5)

## 2017-08-03 LAB — I-STAT TROPONIN, ED: TROPONIN I, POC: 0.01 ng/mL (ref 0.00–0.08)

## 2017-08-03 MED ORDER — AZITHROMYCIN 250 MG PO TABS: ORAL_TABLET | ORAL | 0 refills | Status: DC

## 2017-08-03 NOTE — ED Notes (Signed)
Patient transported to X-ray 

## 2017-08-03 NOTE — Discharge Instructions (Signed)
Testing today indicates that you have a pneumonia.  This may be related to a bacterial infection or a virus.  We are giving him a prescription for an antibiotic in case of a bacterial infection.  Other things that you can do to help your discomfort, are drinking a lot of fluids, and nasal decongestion such as Afrin, a cough medicine such as Robitussin-DM.  Your blood pressure is elevated today and has been previously when you are in the emergency department.  Something that you can do to help the elevated blood pressure are watching salt intake, controlling weight, and getting regular daily exercise.

## 2017-08-03 NOTE — ED Triage Notes (Signed)
Pt states chest pain with exertion, walking up the stairs, or when lying down. Pt has obvious nasal congestion, post nasal drip. Pt states chest pain radiating to back. Pt also states body aches.

## 2017-08-03 NOTE — ED Notes (Signed)
ED Provider at bedside. 

## 2017-08-03 NOTE — ED Provider Notes (Signed)
[facility] Provider Note   CSN: [id number] Arrival date & time: 08/03/17  1600     History   Chief Complaint Chief Complaint  Patient presents with  . Chest Pain  . URI    HPI Rose Saunders is a 31 y.o. female.  She presents for evaluation of exertional chest pain.  She tends to notice it when she walks upstairs.  She has been bothered by nasal congestion and postnasal drip recently.  Her chest pain radiates to her back.  Her cough is occasionally productive of a small amount of sputum.  She feels warm inside but has not taken her temperature.  She is able to eat.  She denies dizziness or paresthesia.  She has never been treated for high blood pressure.  She works in a call center.  She does not smoke cigarettes.  There are no other known modifying factors.   HPI  Past Medical History:  Diagnosis Date  . UTI (lower urinary tract infection)     There are no active problems to display for this patient.   Past Surgical History:  Procedure Laterality Date  . CESAREAN SECTION      OB History    No data available       Home Medications    Prior to Admission medications   Medication Sig Start Date End Date Taking? Authorizing Provider  azithromycin (ZITHROMAX Z-PAK) 250 MG tablet 2 po day one, then 1 daily x 4 days 08/03/17   Mancel Bale, MD  naproxen (NAPROSYN) 500 MG tablet Take 1 tablet (500 mg total) by mouth 2 (two) times daily. 12/02/16   Cheri Fowler, PA-C  oxyCODONE-acetaminophen (PERCOCET/ROXICET) 5-325 MG tablet Take 1 tablet by mouth every 4 (four) hours as needed for severe pain. 12/02/16   Cheri Fowler, PA-C  predniSONE (DELTASONE) 50 MG tablet Take 1 tablet (50 mg total) by mouth daily. 12/30/15   Eustace Moore, MD    Family History No family history on file.  Social History Social History  Substance Use Topics  . Smoking status: Never Smoker  . Smokeless tobacco: Never Used  . Alcohol use Yes     Comment: socially     Allergies     Patient has no known allergies.   Review of Systems Review of Systems  All other systems reviewed and are negative.    Physical Exam Updated Vital Signs BP (!) 155/111 (BP Location: Right Wrist)   Pulse 96   Temp 99 F (37.2 C) (Oral)   Resp 20   LMP 08/01/2017   SpO2 98%   Physical Exam  Constitutional: She is oriented to person, place, and time. She appears well-developed and well-nourished.  HENT:  Head: Normocephalic and atraumatic.  Nasal congestion bilaterally  Eyes: Pupils are equal, round, and reactive to light. Conjunctivae and EOM are normal.  Neck: Normal range of motion and phonation normal. Neck supple.  Cardiovascular: Normal rate and regular rhythm.   Pulmonary/Chest: Effort normal and breath sounds normal. No respiratory distress. She has no wheezes. She exhibits no tenderness.  Occasional nonproductive cough  Abdominal: Soft. She exhibits no distension. There is no tenderness. There is no guarding.  Musculoskeletal: Normal range of motion.  Neurological: She is alert and oriented to person, place, and time. She exhibits normal muscle tone.  Skin: Skin is warm and dry.  Psychiatric: She has a normal mood and affect. Her behavior is normal. Judgment and thought content normal.  Nursing note and vitals reviewed.  ED Treatments / Results  Labs (all labs ordered are listed, but only abnormal results are displayed) Labs Reviewed  CBC - Abnormal; Notable for the following:       Result Value   Hemoglobin 9.7 (*)    HCT 30.6 (*)    MCV 76.1 (*)    MCH 24.1 (*)    RDW 16.8 (*)    Platelets 428 (*)    All other components within normal limits  BASIC METABOLIC PANEL  I-STAT TROPONIN, ED    EKG  EKG Interpretation  Date/Time:  Saturday August 03 2017 16:06:19 EDT Ventricular Rate:  91 PR Interval:  156 QRS Duration: 80 QT Interval:  348 QTC Calculation: 428 R Axis:   66 Text Interpretation:  Normal sinus rhythm Normal ECG Since last tracing  rate slower Confirmed by Mancel Bale [phone]) on 08/03/2017 6:30:17 PM       Radiology Dg Chest 2 View  Result Date: 08/03/2017 CLINICAL DATA:  Chest pain, dry cough, congestion and sore throat x5 days. Nonsmoker. EXAM: CHEST  2 VIEW COMPARISON:  12/30/2015 FINDINGS: The heart size and mediastinal contours are within normal limits. A pneumonic consolidation is seen in the right upper lobe, new since comparison exam. No apparent pleural effusion. No pneumothorax. Chronic stable mild midthoracic dextroscoliosis is noted unchanged in appearance. IMPRESSION: Right upper lobe pulmonary consolidation consistent with pneumonia. No appearing parapneumonic effusion. Electronically Signed   By: Tollie Eth M.D.   On: 08/03/2017 16:43    Procedures Procedures (including critical care time)  Medications Ordered in ED Medications - No data to display   Initial Impression / Assessment and Plan / ED Course  I have reviewed the triage vital signs and the nursing notes.  Pertinent labs & imaging results that were available during my care of the patient were reviewed by me and considered in my medical decision making (see chart for details).      Patient Vitals for the past 24 hrs:  BP Temp Temp src Pulse Resp SpO2  08/03/17 1744 (!) 155/111 99 F (37.2 C) Oral 96 20 98 %  08/03/17 1613 (!) 143/106 98.3 F (36.8 C) - 96 18 100 %    6:46 PM Reevaluation with update and discussion. After initial assessment and treatment, an updated evaluation reveals no change in clinical status.  She relates that she has heavy and prolonged periods.  Findings discussed with patient and mother, all questions answered. Rose Saunders      Final Clinical Impressions(s) / ED Diagnoses   Final diagnoses:  Community acquired pneumonia of right middle lobe of lung (HCC)  Hypertension, unspecified type  Iron deficiency anemia due to chronic blood loss  Menorrhagia with regular cycle    Valuations consistent  with pneumonia, without complicating features.  Incidental hypertension, anemia, and apparent menorrhagia.  No evidence for hemodynamic instability metabolic instability or impending vascular collapse.  Nursing Notes Reviewed/ Care Coordinated Applicable Imaging Reviewed Interpretation of Laboratory Data incorporated into ED treatment  The patient appears reasonably screened and/or stabilized for discharge and I doubt any other medical condition or other [facility] requiring further screening, evaluation, or treatment in the ED at this time prior to discharge.  Plan: Home Medications-OTC, as needed; Home Treatments-low-salt diet, exercise, weight control; return here if the recommended treatment, does not improve the symptoms; Recommended follow up-PCP follow-up 1 week and as needed   New Prescriptions New Prescriptions   AZITHROMYCIN (ZITHROMAX Z-PAK) 250 MG TABLET    2 po day one,  then 1 daily x 4 days     Rose Saunders, EllMancel Bale/15/18 [phone]

## 2017-12-13 ENCOUNTER — Emergency Department (HOSPITAL_COMMUNITY): Payer: PRIVATE HEALTH INSURANCE

## 2017-12-13 ENCOUNTER — Emergency Department (HOSPITAL_COMMUNITY)
Admission: EM | Admit: 2017-12-13 | Discharge: 2017-12-14 | Disposition: A | Discharge status: Home / Self Care | Payer: PRIVATE HEALTH INSURANCE | Attending: Emergency Medicine | Admitting: Emergency Medicine

## 2017-12-13 ENCOUNTER — Encounter (HOSPITAL_COMMUNITY): Payer: Self-pay

## 2017-12-13 ENCOUNTER — Other Ambulatory Visit: Payer: Self-pay

## 2017-12-13 DIAGNOSIS — J039 Acute tonsillitis, unspecified: Secondary | ICD-10-CM

## 2017-12-13 LAB — BASIC METABOLIC PANEL
ANION GAP: 13 (ref 5–15)
BUN: 5 mg/dL — ABNORMAL LOW (ref 6–20)
CHLORIDE: 104 mmol/L (ref 101–111)
CO2: 23 mmol/L (ref 22–32)
Calcium: 9 mg/dL (ref 8.9–10.3)
Creatinine, Ser: 0.71 mg/dL (ref 0.44–1.00)
GFR calc non Af Amer: >60 mL/min (ref >60)
GLUCOSE: 92 mg/dL (ref 65–99)
Potassium: 3.9 mmol/L (ref 3.5–5.1)
Sodium: 140 mmol/L (ref 135–145)

## 2017-12-13 LAB — CBC
HEMATOCRIT: 32.6 % — AB (ref 36.0–46.0)
HEMOGLOBIN: 10.2 g/dL — AB (ref 12.0–15.0)
MCH: 23.9 pg — ABNORMAL LOW (ref 26.0–34.0)
MCHC: 31.3 g/dL (ref 30.0–36.0)
MCV: 76.3 fL — AB (ref 78.0–100.0)
Platelets: 392 10*3/uL (ref 150–400)
RBC: 4.27 MIL/uL (ref 3.87–5.11)
RDW: 16.9 % — ABNORMAL HIGH (ref 11.5–15.5)
WBC: 6.1 10*3/uL (ref 4.0–10.5)

## 2017-12-13 LAB — RAPID STREP SCREEN (MED CTR MEBANE ONLY): Streptococcus, Group A Screen (Direct): NEGATIVE

## 2017-12-13 MED ORDER — IOPAMIDOL (ISOVUE-300) INJECTION 61%
INTRAVENOUS | Status: AC
  Administered 2017-12-13: 75 mL
  Filled 2017-12-13: qty 75

## 2017-12-13 MED ORDER — OXYCODONE HCL 5 MG PO TABS
5.0000 mg | ORAL_TABLET | Freq: Once | ORAL | Status: AC
  Administered 2017-12-13: 5 mg via ORAL
  Filled 2017-12-13: qty 1

## 2017-12-13 MED ORDER — SODIUM CHLORIDE 0.9 % IV BOLUS (SEPSIS)
1000.0000 mL | Freq: Once | INTRAVENOUS | Status: AC
  Administered 2017-12-13: 1000 mL via INTRAVENOUS

## 2017-12-13 NOTE — ED Provider Notes (Signed)
[facility] EMERGENCY DEPARTMENT Provider Note   CSN: [id number] Arrival date & time: 12/13/17  1803     History   Chief Complaint Chief Complaint  Patient presents with  . Sore Throat    HPI Rose Saunders is a 32 y.o. female with no major medical problems presents to the Emergency Department complaining of gradual, persistent, progressively worsening sore throat onset 1 week ago.  Patient reports her son was sick with URI symptoms but no other sick contacts.  She reports sore throat and inability to swallow but denies rhinorrhea, cough, fevers.  Patient reports decreased oral intake due to the significant pain.  Nothing makes her symptoms better or worse.  She reports associated mild headache and nausea.  No vomiting or diarrhea. She denies, neck stiffness, chest pain, shortness of breath, abdominal pain, vomiting, diarrhea, weakness, dizziness, syncope.  The history is provided by the patient and medical records. No language interpreter was used.    Past Medical History:  Diagnosis Date  . UTI (lower urinary tract infection)     There are no active problems to display for this patient.   Past Surgical History:  Procedure Laterality Date  . CESAREAN SECTION      OB History    No data available       Home Medications    Prior to Admission medications   Medication Sig Start Date End Date Taking? Authorizing Provider  azithromycin (ZITHROMAX Z-PAK) 250 MG tablet 2 po day one, then 1 daily x 4 days Patient not taking: Reported on 12/14/2017 08/03/17   Mancel Bale, MD  clindamycin (CLEOCIN) 300 MG capsule Take 1 capsule (300 mg total) by mouth 3 (three) times daily. 12/14/17   Azalia Bilis, MD  ibuprofen (ADVIL,MOTRIN) 600 MG tablet Take 1 tablet (600 mg total) by mouth every 8 (eight) hours as needed. 12/14/17   Azalia Bilis, MD  naproxen (NAPROSYN) 500 MG tablet Take 1 tablet (500 mg total) by mouth 2 (two) times daily. Patient not taking:  Reported on 12/14/2017 12/02/16   Cheri Fowler, PA-C  oxyCODONE-acetaminophen (PERCOCET/ROXICET) 5-325 MG tablet Take 1 tablet by mouth every 4 (four) hours as needed for severe pain. Patient not taking: Reported on 12/14/2017 12/02/16   Cheri Fowler, PA-C  predniSONE (DELTASONE) 50 MG tablet Take 1 tablet (50 mg total) by mouth daily. Patient not taking: Reported on 12/14/2017 12/30/15   Isa Rankin, MD    Family History History reviewed. No pertinent family history.  Social History Social History   Tobacco Use  . Smoking status: Never Smoker  . Smokeless tobacco: Never Used  Substance Use Topics  . Alcohol use: Yes    Comment: socially  . Drug use: No     Allergies   Patient has no known allergies.   Review of Systems Review of Systems  Constitutional: Positive for fatigue. Negative for appetite change, diaphoresis, fever and unexpected weight change.  HENT: Positive for sore throat and trouble swallowing. Negative for mouth sores.   Eyes: Negative for visual disturbance.  Respiratory: Negative for cough, chest tightness, shortness of breath and wheezing.   Cardiovascular: Negative for chest pain.  Gastrointestinal: Positive for nausea. Negative for abdominal pain, constipation, diarrhea and vomiting.  Endocrine: Negative for polydipsia, polyphagia and polyuria.  Genitourinary: Negative for dysuria, frequency, hematuria and urgency.  Musculoskeletal: Negative for back pain, neck pain and neck stiffness.  Skin: Negative for rash.  Allergic/Immunologic: Negative for immunocompromised state.  Neurological: Positive for headaches. Negative  for syncope and light-headedness.  Hematological: Does not bruise/bleed easily.  Psychiatric/Behavioral: Negative for sleep disturbance. The patient is not nervous/anxious.      Physical Exam Updated Vital Signs BP (!) 157/122 (BP Location: Right Arm)   Pulse (!) 101   Temp 99.8 F (37.7 C) (Oral)   Resp 18   Ht 5\' 4"  (1.626 m)    Wt (!) 145.2 kg (320 lb)   LMP 11/13/2017 (Exact Date)   SpO2 99%   BMI 54.93 kg/m   Physical Exam  Constitutional: She appears well-developed and well-nourished. No distress.  HENT:  Head: Normocephalic and atraumatic.  Right Ear: Tympanic membrane, external ear and ear canal normal.  Left Ear: Tympanic membrane, external ear and ear canal normal.  Nose: Nose normal. No mucosal edema or rhinorrhea.  Mouth/Throat: Uvula is midline and mucous membranes are normal. Mucous membranes are not dry. No trismus in the jaw. No uvula swelling. Posterior oropharyngeal edema and posterior oropharyngeal erythema present. No oropharyngeal exudate or tonsillar abscesses.  Posterior oropharynx with symmetrical erythema and edema of the tonsils  Eyes: Conjunctivae are normal.  Neck: Normal range of motion, full passive range of motion without pain and phonation normal. No tracheal tenderness, no spinous process tenderness and no muscular tenderness present. No neck rigidity. No erythema and normal range of motion present. No Brudzinski's sign and no Kernig's sign noted.  Range of motion without pain  No midline or paraspinal tenderness Muffled phonation No stridor Spitting into a bag No nuchal rigidity or meningeal signs  Cardiovascular: Regular rhythm and normal heart sounds. Tachycardia present.  Pulses:      Radial pulses are 2+ on the right side, and 2+ on the left side.  Pulmonary/Chest: Effort normal and breath sounds normal. No stridor. No respiratory distress. She has no decreased breath sounds. She has no wheezes.  Equal chest expansion, clear and equal breath sounds without focal wheezes, rhonchi or rales  Musculoskeletal: Normal range of motion.  Lymphadenopathy:       Head (right side): Submandibular and tonsillar adenopathy present. No submental, no preauricular, no posterior auricular and no occipital adenopathy present.       Head (left side): Submandibular and tonsillar adenopathy  present. No submental, no preauricular, no posterior auricular and no occipital adenopathy present.    She has cervical adenopathy.       Right cervical: Superficial cervical adenopathy present. No deep cervical and no posterior cervical adenopathy present.      Left cervical: Superficial cervical adenopathy present. No deep cervical and no posterior cervical adenopathy present.  Neurological: She is alert.  Alert and oriented Moves all extremities without ataxia  Skin: Skin is warm and dry. She is not diaphoretic.  Psychiatric: She has a normal mood and affect.  Nursing note and vitals reviewed.    ED Treatments / Results  Labs (all labs ordered are listed, but only abnormal results are displayed) Labs Reviewed  CBC - Abnormal; Notable for the following components:      Result Value   Hemoglobin 10.2 (*)    HCT 32.6 (*)    MCV 76.3 (*)    MCH 23.9 (*)    RDW 16.9 (*)    All other components within normal limits  BASIC METABOLIC PANEL - Abnormal; Notable for the following components:   BUN 5 (*)    All other components within normal limits  RAPID STREP SCREEN (NOT AT [facility])  CULTURE, GROUP A STREP [facility])     Radiology  Ct Soft Tissue Neck W Contrast  Result Date: 12/14/2017 CLINICAL DATA:  Initial evaluation for acute sore throat for 1 week. EXAM: CT NECK WITH CONTRAST TECHNIQUE: Multidetector CT imaging of the neck was performed using the standard protocol following the bolus administration of intravenous contrast. CONTRAST:  75mL ISOVUE-300 IOPAMIDOL (ISOVUE-300) INJECTION 61% COMPARISON:  None. FINDINGS: Pharynx and larynx: Examination somewhat limited by body habitus. Oral cavity within normal limits without mass lesion or loculated fluid collection. No acute abnormality about the dentition. Palatine tonsils are mildly prominent bilaterally but symmetric in size and appearance. Adenoidal soft tissues prominent as well. No associated significant inflammatory changes. No tonsillar  or peritonsillar abscess. Parapharyngeal fat maintained. Nasopharynx within normal limits. Retropharyngeal soft tissues within normal limits. Epiglottis within normal limits. Vallecula grossly clear. Remainder of the hypopharynx and supraglottic larynx without acute abnormality. True cords symmetric and within normal limits. Subglottic airway clear. Salivary glands: Salivary glands including the parotid and submandibular glands are normal. Thyroid: Thyroid grossly unremarkable, although limited evaluation due to body habitus. Lymph nodes: Mildly prominent level 2 lymph nodes measure up to 13 mm, slightly more prominent on the left, which may be reactive. Vascular: Normal intravascular enhancement seen within the neck. Limited intracranial: Unremarkable. Visualized orbits: Visualized globes and orbital soft tissues within normal limits. Mastoids and visualized paranasal sinuses: Scattered mucosal thickening within the ethmoidal air cells. Visualized paranasal sinuses are otherwise clear. Mastoid air cells and middle ear cavities are clear. Skeleton: No acute osseous abnormality. No worrisome lytic or blastic osseous lesions. Upper chest: Visualized upper chest demonstrates no acute abnormality. Partially visualized lungs are clear. Other: None. IMPRESSION: 1. Mild prominence of the palatine tonsils and adenoidal soft tissues, which may reflect sequelae of acute tonsillitis. No discrete tonsillar or peritonsillar abscess. 2. Mildly prominent bilateral level II lymph nodes, likely reactive. Electronically Signed   By: Rise MuBenjamin  McClintock M.D.   On: 12/14/2017 04:57    Procedures Procedures (including critical care time)  Medications Ordered in ED Medications  dexamethasone (DECADRON) injection 10 mg (not administered)  clindamycin (CLEOCIN) capsule 300 mg (not administered)  sodium chloride 0.9 % bolus 1,000 mL (0 mLs Intravenous Stopped 12/14/17 0139)  oxyCODONE (Oxy IR/ROXICODONE) immediate release tablet  5 mg (5 mg Oral Given 12/13/17 2311)  iopamidol (ISOVUE-300) 61 % injection (75 mLs  Contrast Given 12/13/17 2337)     Initial Impression / Assessment and Plan / ED Course  I have reviewed the triage vital signs and the nursing notes.  Pertinent labs & imaging results that were available during my care of the patient were reviewed by me and considered in my medical decision making (see chart for details).     Patient presents with sore throat, muffled voice and inability to swallow.  Neck is not stiff.  Less likely to be retropharyngeal abscess.  Floor of the mouth is soft.  No woody induration underneath the tongue.  Tonsils and surrounding structures are symmetrically swollen.  Less likely to be peritonsillar abscess however concern for deep-seated infection as patient is unable to swallow with change in voice.  Rapid strep screen is negative.  Will obtain lab work and CT scan.   5:27 AM CT scan shows evidence of tonsillitis but no evidence of retropharyngeal abscess or peritonsillar abscess.  Patient will be given Decadron and clindamycin.  Close follow-up with primary care physician.  Reasons to return to the emergency department discussed.    The patient was discussed with and seen by Dr. Patria Maneampos who agrees  with the treatment plan.   Final Clinical Impressions(s) / ED Diagnoses   Final diagnoses:  Acute tonsillitis, unspecified etiology    ED Discharge Orders        Ordered    ibuprofen (ADVIL,MOTRIN) 600 MG tablet  Every 8 hours PRN     12/14/17 0509    clindamycin (CLEOCIN) 300 MG capsule  3 times daily     12/14/17 0509       Oval Moralez, Dahlia Client, PA-C 12/14/17 0527    Azalia Bilis, MD 12/14/17 [phone]

## 2017-12-13 NOTE — ED Triage Notes (Signed)
Pt endorses sore throat x 1 week, denies fever. VSS. Afebrile.

## 2017-12-14 MED ORDER — CLINDAMYCIN HCL 300 MG PO CAPS: 300.0000 mg | ORAL_CAPSULE | Freq: Three times a day (TID) | ORAL | 0 refills | Status: DC

## 2017-12-14 MED ORDER — CLINDAMYCIN HCL 150 MG PO CAPS
300.0000 mg | ORAL_CAPSULE | Freq: Once | ORAL | Status: AC
Start: 2017-12-14 — End: 2017-12-14
  Administered 2017-12-14: 300 mg via ORAL
  Filled 2017-12-14: qty 2

## 2017-12-14 MED ORDER — IBUPROFEN 600 MG PO TABS: 600.0000 mg | ORAL_TABLET | Freq: Three times a day (TID) | ORAL | 0 refills | Status: DC | PRN

## 2017-12-14 MED ORDER — DEXAMETHASONE SODIUM PHOSPHATE 10 MG/ML IJ SOLN
10.0000 mg | Freq: Once | INTRAMUSCULAR | Status: AC
  Administered 2017-12-14: 10 mg via INTRAVENOUS
  Filled 2017-12-14: qty 1

## 2017-12-15 LAB — CULTURE, GROUP A STREP (THRC)

## 2019-11-08 ENCOUNTER — Emergency Department (HOSPITAL_COMMUNITY): Payer: Self-pay

## 2019-11-08 ENCOUNTER — Encounter (HOSPITAL_COMMUNITY): Payer: Self-pay | Admitting: Emergency Medicine

## 2019-11-08 ENCOUNTER — Other Ambulatory Visit: Payer: Self-pay

## 2019-11-08 ENCOUNTER — Emergency Department (HOSPITAL_COMMUNITY)
Admission: EM | Admit: 2019-11-08 | Discharge: 2019-11-08 | Disposition: A | Discharge status: Home / Self Care | Payer: Self-pay | Attending: Emergency Medicine | Admitting: Emergency Medicine

## 2019-11-08 DIAGNOSIS — G510 Bell's palsy: Secondary | ICD-10-CM | POA: Insufficient documentation

## 2019-11-08 LAB — CBC WITH DIFFERENTIAL/PLATELET
Abs Immature Granulocytes: 0.01 10*3/uL (ref 0.00–0.07)
Basophils Absolute: 0 10*3/uL (ref 0.0–0.1)
Basophils Relative: 1 %
Eosinophils Absolute: 0.1 10*3/uL (ref 0.0–0.5)
Eosinophils Relative: 1 %
HCT: 30.2 % — ABNORMAL LOW (ref 36.0–46.0)
Hemoglobin: 8.7 g/dL — ABNORMAL LOW (ref 12.0–15.0)
Immature Granulocytes: 0 %
Lymphocytes Relative: 56 %
Lymphs Abs: 2.4 10*3/uL (ref 0.7–4.0)
MCH: 20.5 pg — ABNORMAL LOW (ref 26.0–34.0)
MCHC: 28.8 g/dL — ABNORMAL LOW (ref 30.0–36.0)
MCV: 71.2 fL — ABNORMAL LOW (ref 80.0–100.0)
Monocytes Absolute: 0.3 10*3/uL (ref 0.1–1.0)
Monocytes Relative: 7 %
Neutro Abs: 1.5 10*3/uL — ABNORMAL LOW (ref 1.7–7.7)
Neutrophils Relative %: 35 %
Platelets: 312 10*3/uL (ref 150–400)
RBC: 4.24 MIL/uL (ref 3.87–5.11)
RDW: 20.3 % — ABNORMAL HIGH (ref 11.5–15.5)
WBC: 4.3 10*3/uL (ref 4.0–10.5)
nRBC: 0 % (ref 0.0–0.2)

## 2019-11-08 LAB — BASIC METABOLIC PANEL
Anion gap: 9 (ref 5–15)
BUN: 8 mg/dL (ref 6–20)
CO2: 24 mmol/L (ref 22–32)
Calcium: 8.7 mg/dL — ABNORMAL LOW (ref 8.9–10.3)
Chloride: 106 mmol/L (ref 98–111)
Creatinine, Ser: 0.67 mg/dL (ref 0.44–1.00)
GFR calc Af Amer: >60 mL/min (ref >60)
GFR calc non Af Amer: >60 mL/min (ref >60)
Glucose, Bld: 99 mg/dL (ref 70–99)
Potassium: 4 mmol/L (ref 3.5–5.1)
Sodium: 139 mmol/L (ref 135–145)

## 2019-11-08 LAB — I-STAT BETA HCG BLOOD, ED (MC, WL, AP ONLY): I-stat hCG, quantitative: <5 m[IU]/mL (ref <5)

## 2019-11-08 LAB — PROTIME-INR
INR: 0.9 (ref 0.8–1.2)
Prothrombin Time: 12.3 seconds (ref 11.4–15.2)

## 2019-11-08 MED ORDER — ACYCLOVIR 400 MG PO TABS: 400.0000 mg | ORAL_TABLET | Freq: Four times a day (QID) | ORAL | 0 refills | Status: DC

## 2019-11-08 MED ORDER — LORAZEPAM 2 MG/ML IJ SOLN
1.0000 mg | Freq: Once | INTRAMUSCULAR | Status: AC
  Administered 2019-11-08: 1 mg via INTRAVENOUS
  Filled 2019-11-08: qty 1

## 2019-11-08 MED ORDER — PREDNISONE 50 MG PO TABS: 50.0000 mg | ORAL_TABLET | Freq: Every day | ORAL | 0 refills | Status: DC

## 2019-11-08 NOTE — ED Notes (Addendum)
Report given [facility] ED charge nurse.

## 2019-11-08 NOTE — ED Notes (Signed)
Patient ambulating to restroom

## 2019-11-08 NOTE — ED Notes (Signed)
[facility] phone called received to state that they will be here in about 10 minutes to pickup patient and to given patient the ativan once they arrive for comfort

## 2019-11-08 NOTE — ED Triage Notes (Signed)
Pt reports last night around 7pm started having mouth droop. Reports left side of face still feels numb. Pt reports pain when trying to close left eye tightly.

## 2019-11-08 NOTE — ED Notes (Signed)
Patient verbalizes understanding of discharge instructions. Opportunity for questioning and answers were provided. Armband removed by staff, pt discharged from ED. Pt. ambulatory and discharged home.  

## 2019-11-08 NOTE — ED Provider Notes (Signed)
Pt here with L face weakness and LUE weakness.  Could be Bell's palsy but will obtain brain MRI to r/o stroke. Please refer to previous provider's note for complete H&P.   4:24 PM Staff notified that MRI machine at [facility] is not compatible with pt's body habitus.  However, patient report having a panic attack when she was in the MRI machine.  Patient is amenable for a benzodiazepine to help her with her anxiety and we will attempt to obtain imaging again.  6:03 PM Radiology tech report pt unable to fit into scanner machine.  I have reached out to [facility] ER and spoke with Dr. Maryan Rued who agrees to accept for MRI, if negative pt can be discharge with treatment of Bell's palsy.    BP 133/86   Pulse 86   Temp 98.8 F (37.1 C) (Oral)   Resp 16   LMP 10/12/2019   SpO2 100%   Results for orders placed or performed during the hospital encounter of 28/76/81  Basic metabolic panel  Result Value Ref Range   Sodium 139 135 - 145 mmol/L   Potassium 4.0 3.5 - 5.1 mmol/L   Chloride 106 98 - 111 mmol/L   CO2 24 22 - 32 mmol/L   Glucose, Bld 99 70 - 99 mg/dL   BUN 8 6 - 20 mg/dL   Creatinine, Ser 0.67 0.44 - 1.00 mg/dL   Calcium 8.7 (L) 8.9 - 10.3 mg/dL   GFR calc non Af Amer >60 >60 mL/min   GFR calc Af Amer >60 >60 mL/min   Anion gap 9 5 - 15  CBC with Differential  Result Value Ref Range   WBC 4.3 4.0 - 10.5 K/uL   RBC 4.24 3.87 - 5.11 MIL/uL   Hemoglobin 8.7 (L) 12.0 - 15.0 g/dL   HCT 30.2 (L) 36.0 - 46.0 %   MCV 71.2 (L) 80.0 - 100.0 fL   MCH 20.5 (L) 26.0 - 34.0 pg   MCHC 28.8 (L) 30.0 - 36.0 g/dL   RDW 20.3 (H) 11.5 - 15.5 %   Platelets 312 150 - 400 K/uL   nRBC 0.0 0.0 - 0.2 %   Neutrophils Relative % 35 %   Neutro Abs 1.5 (L) 1.7 - 7.7 K/uL   Lymphocytes Relative 56 %   Lymphs Abs 2.4 0.7 - 4.0 K/uL   Monocytes Relative 7 %   Monocytes Absolute 0.3 0.1 - 1.0 K/uL   Eosinophils Relative 1 %   Eosinophils Absolute 0.1 0.0 - 0.5 K/uL   Basophils Relative 1 %   Basophils  Absolute 0.0 0.0 - 0.1 K/uL   WBC Morphology VACUOLATED NEUTROPHILS    Immature Granulocytes 0 %   Abs Immature Granulocytes 0.01 0.00 - 0.07 K/uL   Reactive, Benign Lymphocytes PRESENT    Polychromasia PRESENT    Target Cells PRESENT   Protime-INR  Result Value Ref Range   Prothrombin Time 12.3 11.4 - 15.2 seconds   INR 0.9 0.8 - 1.2  I-Stat beta hCG blood, ED  Result Value Ref Range   I-stat hCG, quantitative <5.0 <5 mIU/mL   Comment 3           No results found.      Domenic Moras, PA-C 11/08/19 1820    Lacretia Leigh, MD 11/10/19 [phone]

## 2019-11-08 NOTE — ED Provider Notes (Signed)
[facility] DEPT Provider Note   CSN: [id number] Arrival date & time: 11/08/19  1359     History Chief Complaint  Patient presents with  . facial numbness    Rose Saunders is a 33 y.o. female without known medical problems, presenting to the emergency department with complaint of left-sided facial numbness and mouth droop that she noticed around 7 PM last night.  She states she felt as though her mouth was not working properly when speaking and she felt a numbness to her left face.  She states she feels a tightness lateral to her left eye as well.  She has been having on and off headache over the last week.  Denies vision changes, speech difficulty, changes in taste, numbness or weakness in extremities, or balance problems.  No recent illnesses.  The history is provided by the patient.       Past Medical History:  Diagnosis Date  . UTI (lower urinary tract infection)     There are no problems to display for this patient.   Past Surgical History:  Procedure Laterality Date  . CESAREAN SECTION       OB History   No obstetric history on file.     No family history on file.  Social History   Tobacco Use  . Smoking status: Never Smoker  . Smokeless tobacco: Never Used  Substance Use Topics  . Alcohol use: Yes    Comment: socially  . Drug use: No    Home Medications Prior to Admission medications   Medication Sig Start Date End Date Taking? Authorizing Provider  azithromycin (ZITHROMAX Z-PAK) 250 MG tablet 2 po day one, then 1 daily x 4 days Patient not taking: Reported on 12/14/2017 08/03/17   Daleen Bo, MD  clindamycin (CLEOCIN) 300 MG capsule Take 1 capsule (300 mg total) by mouth 3 (three) times daily. 12/14/17   Jola Schmidt, MD  ibuprofen (ADVIL,MOTRIN) 600 MG tablet Take 1 tablet (600 mg total) by mouth every 8 (eight) hours as needed. 12/14/17   Jola Schmidt, MD  naproxen (NAPROSYN) 500 MG tablet Take 1 tablet (500 mg  total) by mouth 2 (two) times daily. Patient not taking: Reported on 12/14/2017 12/02/16   Gloriann Loan, PA-C  oxyCODONE-acetaminophen (PERCOCET/ROXICET) 5-325 MG tablet Take 1 tablet by mouth every 4 (four) hours as needed for severe pain. Patient not taking: Reported on 12/14/2017 12/02/16   Gloriann Loan, PA-C  predniSONE (DELTASONE) 50 MG tablet Take 1 tablet (50 mg total) by mouth daily. Patient not taking: Reported on 12/14/2017 12/30/15   Wynona Luna, MD    Allergies    Patient has no known allergies.  Review of Systems   Review of Systems  All other systems reviewed and are negative.   Physical Exam Updated Vital Signs BP (!) 176/88 (BP Location: Left Arm)   Pulse 91   Temp 98.8 F (37.1 C) (Oral)   Resp 16   LMP 10/12/2019   SpO2 99%   Physical Exam Vitals and nursing note reviewed.  Constitutional:      General: She is not in acute distress.    Appearance: She is well-developed. She is obese.  HENT:     Head: Normocephalic and atraumatic.  Eyes:     Conjunctiva/sclera: Conjunctivae normal.  Cardiovascular:     Rate and Rhythm: Normal rate and regular rhythm.  Pulmonary:     Effort: Pulmonary effort is normal. No respiratory distress.     Breath sounds:  Normal breath sounds.  Abdominal:     Palpations: Abdomen is soft.  Skin:    General: Skin is warm.  Neurological:     Mental Status: She is alert.     Comments: Mental Status:  Alert, oriented, thought content appropriate, able to give a coherent history. Speech fluent without evidence of aphasia. Able to follow 2 step commands without difficulty.  Cranial Nerves:  II:  Peripheral visual fields grossly normal, pupils equal, round, reactive to light III,IV, VI: ptosis not present, extra-ocular motions intact bilaterally  V,VII: smile asymmetric w left-sided droop, facial light touch sensation is decreased on left VIII: hearing grossly normal to voice  X: uvula elevates symmetrically  XI: bilateral  shoulder shrug symmetric and strong, dec strength with head turning to left against resistance XII: midline tongue extension without fassiculations Motor:  Normal tone. Grip Strength 4/5 LUE, 5/5 in RUE. BLE with 5/5 strength w dorsiflexion/plantar flexion Sensory: grossly normal in all extremities.  Cerebellar: normal finger-to-nose with bilateral upper extremities, normal heel-to-shin No pronator drift Gait: normal gait and balance CV: distal pulses palpable throughout    Psychiatric:        Behavior: Behavior normal.     ED Results / Procedures / Treatments   Labs (all labs ordered are listed, but only abnormal results are displayed) Labs Reviewed  CBC WITH DIFFERENTIAL/PLATELET - Abnormal; Notable for the following components:      Result Value   Hemoglobin 8.7 (*)    HCT 30.2 (*)    MCV 71.2 (*)    MCH 20.5 (*)    MCHC 28.8 (*)    RDW 20.3 (*)    All other components within normal limits  PROTIME-INR  BASIC METABOLIC PANEL  I-STAT BETA HCG BLOOD, ED ([facility], [facility], [facility] ONLY)    EKG None  Radiology No results found.  Procedures Procedures (including critical care time)  Medications Ordered in ED Medications - No data to display  ED Course  I have reviewed the triage vital signs and the nursing notes.  Pertinent labs & imaging results that were available during my care of the patient were reviewed by me and considered in my medical decision making (see chart for details).    MDM Rules/Calculators/A&P                      Patient is a 33 year old female presenting with left-sided facial numbness and droop that began sometime around 7 PM yesterday evening.  No known medical problems.  Exam with left-sided droop and decreased sensation, as well as decreased strength with testing cranial nerve XI.  Patient also appeared to have some decreased grip strength to left upper extremity.  Given patient's abnormal neuro findings, will obtain labs and MRI for further  evaluation. If imaging is negative, recommend treat for Bell's palsy.  Question patient's effort on neuro exam.  Care assumed at shift change by PA Laveda Norman, follow labs and imaging and determine appropriate disposition.  Patient discussed with and evaluated by Dr. Particia Nearing, who agrees with workup at this time. Final Clinical Impression(s) / ED Diagnoses Final diagnoses:  None    Rx / DC Orders ED Discharge Orders    None       Holley Kocurek, [place] N, PA-C 11/08/19 1529    Jacalyn Lefevre, MD 11/11/19 [phone]

## 2019-11-08 NOTE — ED Notes (Signed)
Attempted to call MRI to get patient to scan

## 2021-02-05 ENCOUNTER — Emergency Department (HOSPITAL_COMMUNITY)
Admission: EM | Admit: 2021-02-05 | Discharge: 2021-02-05 | Disposition: A | Discharge status: Home / Self Care | Payer: No Typology Code available for payment source | Attending: Emergency Medicine | Admitting: Emergency Medicine

## 2021-02-05 ENCOUNTER — Emergency Department (HOSPITAL_COMMUNITY): Payer: No Typology Code available for payment source

## 2021-02-05 ENCOUNTER — Encounter (HOSPITAL_COMMUNITY): Payer: Self-pay | Admitting: Emergency Medicine

## 2021-02-05 DIAGNOSIS — S8992XA Unspecified injury of left lower leg, initial encounter: Secondary | ICD-10-CM | POA: Diagnosis present

## 2021-02-05 DIAGNOSIS — M25562 Pain in left knee: Secondary | ICD-10-CM | POA: Insufficient documentation

## 2021-02-05 DIAGNOSIS — M25571 Pain in right ankle and joints of right foot: Secondary | ICD-10-CM | POA: Insufficient documentation

## 2021-02-05 DIAGNOSIS — M25512 Pain in left shoulder: Secondary | ICD-10-CM | POA: Diagnosis not present

## 2021-02-05 DIAGNOSIS — Y92410 Unspecified street and highway as the place of occurrence of the external cause: Secondary | ICD-10-CM | POA: Diagnosis not present

## 2021-02-05 DIAGNOSIS — S8012XA Contusion of left lower leg, initial encounter: Secondary | ICD-10-CM | POA: Diagnosis not present

## 2021-02-05 MED ORDER — KETOROLAC TROMETHAMINE 60 MG/2ML IM SOLN
30.0000 mg | Freq: Once | INTRAMUSCULAR | Status: AC
  Administered 2021-02-05: 30 mg via INTRAMUSCULAR
  Filled 2021-02-05: qty 2

## 2021-02-05 MED ORDER — ACETAMINOPHEN 325 MG PO TABS
650.0000 mg | ORAL_TABLET | Freq: Once | ORAL | Status: AC
  Administered 2021-02-05: 650 mg via ORAL
  Filled 2021-02-05: qty 2

## 2021-02-05 MED ORDER — METHOCARBAMOL 500 MG PO TABS: 500.0000 mg | ORAL_TABLET | Freq: Two times a day (BID) | ORAL | 0 refills | Status: AC

## 2021-02-05 NOTE — ED Notes (Signed)
Pt transported for imaging. 
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
